# Patient Record
Sex: Male | Born: 1960 | Race: White | Hispanic: No | Marital: Single | Smoking: Never smoker
Health system: Southern US, Community
[De-identification: ages and names within clinical notes are randomized; demographics above are authoritative.]

## PROBLEM LIST (undated history)

## (undated) DIAGNOSIS — E785 Hyperlipidemia, unspecified: Secondary | ICD-10-CM

## (undated) DIAGNOSIS — R011 Cardiac murmur, unspecified: Secondary | ICD-10-CM

## (undated) DIAGNOSIS — F419 Anxiety disorder, unspecified: Secondary | ICD-10-CM

## (undated) DIAGNOSIS — Z8719 Personal history of other diseases of the digestive system: Secondary | ICD-10-CM

## (undated) DIAGNOSIS — F329 Major depressive disorder, single episode, unspecified: Secondary | ICD-10-CM

## (undated) DIAGNOSIS — K219 Gastro-esophageal reflux disease without esophagitis: Secondary | ICD-10-CM

## (undated) DIAGNOSIS — K649 Unspecified hemorrhoids: Secondary | ICD-10-CM

## (undated) DIAGNOSIS — I808 Phlebitis and thrombophlebitis of other sites: Secondary | ICD-10-CM

## (undated) DIAGNOSIS — D126 Benign neoplasm of colon, unspecified: Secondary | ICD-10-CM

## (undated) DIAGNOSIS — F32A Depression, unspecified: Secondary | ICD-10-CM

## (undated) DIAGNOSIS — N201 Calculus of ureter: Secondary | ICD-10-CM

## (undated) HISTORY — PX: APPENDECTOMY: SHX54

## (undated) HISTORY — PX: ROOT CANAL: SHX2363

## (undated) HISTORY — DX: Benign neoplasm of colon, unspecified: D12.6

## (undated) HISTORY — PX: OTHER SURGICAL HISTORY: SHX169

## (undated) HISTORY — PX: COLONOSCOPY: SHX174

## (undated) HISTORY — DX: Phlebitis and thrombophlebitis of other sites: I80.8

## (undated) HISTORY — DX: Unspecified hemorrhoids: K64.9

## (undated) HISTORY — DX: Cardiac murmur, unspecified: R01.1

## (undated) HISTORY — DX: Anxiety disorder, unspecified: F41.9

## (undated) HISTORY — PX: UPPER GI ENDOSCOPY: SHX6162

---

## 2012-05-23 ENCOUNTER — Emergency Department (HOSPITAL_COMMUNITY): Payer: Self-pay

## 2012-05-23 ENCOUNTER — Emergency Department (HOSPITAL_COMMUNITY)
Admission: EM | Admit: 2012-05-23 | Discharge: 2012-05-24 | Disposition: A | Discharge status: Home / Self Care | Payer: Self-pay | Attending: Emergency Medicine | Admitting: Emergency Medicine

## 2012-05-23 ENCOUNTER — Encounter (HOSPITAL_COMMUNITY): Payer: Self-pay | Admitting: *Deleted

## 2012-05-23 DIAGNOSIS — Z79899 Other long term (current) drug therapy: Secondary | ICD-10-CM | POA: Insufficient documentation

## 2012-05-23 DIAGNOSIS — R1032 Left lower quadrant pain: Secondary | ICD-10-CM | POA: Insufficient documentation

## 2012-05-23 DIAGNOSIS — Z7982 Long term (current) use of aspirin: Secondary | ICD-10-CM | POA: Insufficient documentation

## 2012-05-23 DIAGNOSIS — N200 Calculus of kidney: Secondary | ICD-10-CM | POA: Insufficient documentation

## 2012-05-23 LAB — LIPASE, BLOOD: Lipase: 28 U/L (ref 11–59)

## 2012-05-23 LAB — URINALYSIS, ROUTINE W REFLEX MICROSCOPIC
Ketones, ur: NEGATIVE mg/dL
Leukocytes, UA: NEGATIVE
Nitrite: NEGATIVE
Protein, ur: NEGATIVE mg/dL

## 2012-05-23 LAB — CBC WITH DIFFERENTIAL/PLATELET
Basophils Relative: 0 % (ref 0–1)
HCT: 44.1 % (ref 39.0–52.0)
Hemoglobin: 15.5 g/dL (ref 13.0–17.0)
MCHC: 35.1 g/dL (ref 30.0–36.0)
Monocytes Absolute: 0.5 10*3/uL (ref 0.1–1.0)
Monocytes Relative: 6 % (ref 3–12)
Neutro Abs: 5.2 10*3/uL (ref 1.7–7.7)

## 2012-05-23 LAB — COMPREHENSIVE METABOLIC PANEL
BUN: 12 mg/dL (ref 6–23)
CO2: 32 mEq/L (ref 19–32)
Chloride: 99 mEq/L (ref 96–112)
Creatinine, Ser: 0.97 mg/dL (ref 0.50–1.35)
GFR calc Af Amer: >90 mL/min (ref >90)
GFR calc non Af Amer: >90 mL/min (ref >90)
Glucose, Bld: 97 mg/dL (ref 70–99)
Total Bilirubin: 0.3 mg/dL (ref 0.3–1.2)

## 2012-05-23 NOTE — ED Notes (Signed)
abd cramping x 3 days; denies nausea/vomiting/diarrhea; denies fever

## 2012-05-24 MED ORDER — TAMSULOSIN HCL 0.4 MG PO CAPS: 0.4000 mg | ORAL_CAPSULE | Freq: Every day | ORAL | Status: DC

## 2012-05-24 MED ORDER — ONDANSETRON HCL 4 MG PO TABS: 4.0000 mg | ORAL_TABLET | Freq: Four times a day (QID) | ORAL | Status: DC

## 2012-05-24 MED ORDER — OXYCODONE-ACETAMINOPHEN 5-325 MG PO TABS: 2.0000 | ORAL_TABLET | ORAL | Status: DC | PRN

## 2012-05-24 NOTE — ED Provider Notes (Signed)
History     CSN: [id number]  Arrival date & time 05/23/12  2142   First MD Initiated Contact with Patient 05/23/12 2244      Chief Complaint  Patient presents with  . Abdominal Pain    (Consider location/radiation/quality/duration/timing/severity/associated sxs/prior treatment) HPI 51 year old male presents to emergency room with complaint of 7-10 days of intermittent left sided abdominal pain. Pain has ranged from lower abdomen to left lower quadrant. It lasts for a few days and then resolves. Tonight, he was at a play when he had acute onset of severe stabbing left lower quadrant pain. Patient denies previous history of kidney stones, though reports his father has history of period no history of diverticulitis, has not had a colonoscopy done yet. He denies any fever. He has not had any nausea vomiting or diarrhea. Patient has been self treating with Valium and Naprosyn which is helped symptoms somewhat. Pain was severe today bringing him into the emergency department.  History reviewed. No pertinent past medical history.  History reviewed. No pertinent past surgical history.  No family history on file.  History  Substance Use Topics  . Smoking status: Never Smoker   . Smokeless tobacco: Not on file  . Alcohol Use: No      Review of Systems  All other systems reviewed and are negative.    Allergies  Cleocin and Tetracyclines & related  Home Medications   Current Outpatient Rx  Name Route Sig Dispense Refill  . ASPIRIN EC 81 MG PO TBEC Oral Take 81 mg by mouth daily.    Marland Kitchen DIAZEPAM 5 MG PO TABS Oral Take 5 mg by mouth daily as needed. For anxiety    . ESCITALOPRAM OXALATE 10 MG PO TABS Oral Take 10 mg by mouth at bedtime.    Marland Kitchen SIMVASTATIN 10 MG PO TABS Oral Take 10 mg by mouth at bedtime.      BP 147/93  Pulse 69  Temp 98.4 F (36.9 C)  Resp 20  SpO2 100%  Physical Exam  Nursing note and vitals reviewed. Constitutional: He is oriented to person, place, and  time. He appears well-developed and well-nourished.  HENT:  Head: Normocephalic and atraumatic.  Nose: Nose normal.  Mouth/Throat: Oropharynx is clear and moist.  Eyes: Conjunctivae normal and EOM are normal. Pupils are equal, round, and reactive to light.  Neck: Normal range of motion. Neck supple. No JVD present. No tracheal deviation present. No thyromegaly present.  Cardiovascular: Normal rate, regular rhythm, normal heart sounds and intact distal pulses.  Exam reveals no gallop and no friction rub.   No murmur heard. Pulmonary/Chest: Effort normal and breath sounds normal. No stridor. No respiratory distress. He has no wheezes. He has no rales. He exhibits no tenderness.  Abdominal: Soft. Bowel sounds are normal. He exhibits no distension and no mass. There is no tenderness. There is no rebound and no guarding.       No CVA tenderness  Musculoskeletal: Normal range of motion. He exhibits no edema and no tenderness.  Lymphadenopathy:    He has no cervical adenopathy.  Neurological: He is alert and oriented to person, place, and time. He exhibits normal muscle tone. Coordination normal.  Skin: Skin is warm and dry. No rash noted. No erythema. No pallor.  Psychiatric: He has a normal mood and affect. His behavior is normal. Judgment and thought content normal.    ED Course  Procedures (including critical care time)  Results for orders placed during the hospital encounter of  05/23/12  CBC WITH DIFFERENTIAL      Component Value Range   WBC 7.9  4.0 - 10.5 K/uL   RBC 5.00  4.22 - 5.81 MIL/uL   Hemoglobin 15.5  13.0 - 17.0 g/dL   HCT 09.8  11.9 - 14.7 %   MCV 88.2  78.0 - 100.0 fL   MCH 31.0  26.0 - 34.0 pg   MCHC 35.1  30.0 - 36.0 g/dL   RDW 82.9  56.2 - 13.0 %   Platelets 214  150 - 400 K/uL   Neutrophils Relative 66  43 - 77 %   Neutro Abs 5.2  1.7 - 7.7 K/uL   Lymphocytes Relative 27  12 - 46 %   Lymphs Abs 2.1  0.7 - 4.0 K/uL   Monocytes Relative 6  3 - 12 %   Monocytes  Absolute 0.5  0.1 - 1.0 K/uL   Eosinophils Relative 1  0 - 5 %   Eosinophils Absolute 0.1  0.0 - 0.7 K/uL   Basophils Relative 0  0 - 1 %   Basophils Absolute 0.0  0.0 - 0.1 K/uL  COMPREHENSIVE METABOLIC PANEL      Component Value Range   Sodium 139  135 - 145 mEq/L   Potassium 4.1  3.5 - 5.1 mEq/L   Chloride 99  96 - 112 mEq/L   CO2 32  19 - 32 mEq/L   Glucose, Bld 97  70 - 99 mg/dL   BUN 12  6 - 23 mg/dL   Creatinine, Ser 8.65  0.50 - 1.35 mg/dL   Calcium 78.4  8.4 - 69.6 mg/dL   Total Protein 7.7  6.0 - 8.3 g/dL   Albumin 4.1  3.5 - 5.2 g/dL   AST 24  0 - 37 U/L   ALT 20  0 - 53 U/L   Alkaline Phosphatase 81  39 - 117 U/L   Total Bilirubin 0.3  0.3 - 1.2 mg/dL   GFR calc non Af Amer >90  >90 mL/min   GFR calc Af Amer >90  >90 mL/min  LIPASE, BLOOD      Component Value Range   Lipase 28  11 - 59 U/L  URINALYSIS, ROUTINE W REFLEX MICROSCOPIC      Component Value Range   Color, Urine YELLOW  YELLOW   APPearance CLOUDY (*) CLEAR   Specific Gravity, Urine 1.005  1.005 - 1.030   pH 7.0  5.0 - 8.0   Glucose, UA NEGATIVE  NEGATIVE mg/dL   Hgb urine dipstick LARGE (*) NEGATIVE   Bilirubin Urine NEGATIVE  NEGATIVE   Ketones, ur NEGATIVE  NEGATIVE mg/dL   Protein, ur NEGATIVE  NEGATIVE mg/dL   Urobilinogen, UA 0.2  0.0 - 1.0 mg/dL   Nitrite NEGATIVE  NEGATIVE   Leukocytes, UA NEGATIVE  NEGATIVE  URINE MICROSCOPIC-ADD ON      Component Value Range   Squamous Epithelial / LPF RARE  RARE   RBC / HPF 11-20  <3 RBC/hpf   Ct Abdomen Pelvis Wo Contrast  05/24/2012  *RADIOLOGY REPORT*  Clinical Data: Hematuria and left inguinal pain.  CT ABDOMEN AND PELVIS WITHOUT CONTRAST  Technique:  Multidetector CT imaging of the abdomen and pelvis was performed following the standard protocol without intravenous contrast.  Comparison: None.  Findings: Lung bases are clear.  There is no evidence for free intraperitoneal air.  There is a 3.7 cm low density structure involving the right kidney which  is most compatible with  a cortical cyst.  There is mild fullness of the left ureter and concern for a 6 mm stone in the distal left ureter.   It is difficult to know if the stone is adjacent to the ureter or within the ureter. There appears to be mild dilatation and stranding of the left ureter above the calcification suggesting that the stone is within the ureter.  No gross abnormality to the liver, gallbladder, spleen, stomach, pancreas or adrenal glands.  There is mild scoliosis in the lumbar spine.  No evidence for lymphadenopathy or free fluid.  Normal appearance of the prostate, seminal vesicles and urinary bladder.  No acute bony abnormality.  IMPRESSION: There appears to be a 6 mm stone within the distal left ureter. There is minimal dilatation of the left renal collecting system.  Probable right renal cyst.   Original Report Authenticated By: Richarda Overlie, M.D.        1. Kidney stone on left side       MDM  51 year old male with 6 mm stone on the left. This iss upper limit of what should be able to pass on its own per literature, will have patient follow up with the [facility] treat with pain and nausea medicine along with Flomax.        Olivia Mackie, MD 05/24/12 [phone]

## 2012-05-24 NOTE — ED Notes (Signed)
Strainer provided

## 2013-01-27 ENCOUNTER — Other Ambulatory Visit: Payer: Self-pay | Admitting: Urology

## 2013-01-27 ENCOUNTER — Encounter (HOSPITAL_COMMUNITY): Payer: Self-pay | Admitting: Pharmacy Technician

## 2013-02-04 ENCOUNTER — Encounter (HOSPITAL_COMMUNITY): Payer: Self-pay | Admitting: *Deleted

## 2013-02-09 ENCOUNTER — Ambulatory Visit (HOSPITAL_COMMUNITY)
Admission: RE | Admit: 2013-02-09 | Discharge: 2013-02-09 | Disposition: A | Discharge status: Home / Self Care | Payer: BC Managed Care – PPO | Source: Ambulatory Visit | Attending: Urology | Admitting: Urology

## 2013-02-09 ENCOUNTER — Ambulatory Visit (HOSPITAL_COMMUNITY): Payer: BC Managed Care – PPO

## 2013-02-09 ENCOUNTER — Encounter (HOSPITAL_COMMUNITY)
Admission: RE | Disposition: A | Discharge status: Home / Self Care | Payer: Self-pay | Source: Ambulatory Visit | Attending: Urology

## 2013-02-09 ENCOUNTER — Encounter (HOSPITAL_COMMUNITY): Payer: Self-pay | Admitting: *Deleted

## 2013-02-09 DIAGNOSIS — F3289 Other specified depressive episodes: Secondary | ICD-10-CM | POA: Insufficient documentation

## 2013-02-09 DIAGNOSIS — N201 Calculus of ureter: Secondary | ICD-10-CM | POA: Insufficient documentation

## 2013-02-09 DIAGNOSIS — Z79899 Other long term (current) drug therapy: Secondary | ICD-10-CM | POA: Insufficient documentation

## 2013-02-09 DIAGNOSIS — E7800 Pure hypercholesterolemia, unspecified: Secondary | ICD-10-CM | POA: Insufficient documentation

## 2013-02-09 DIAGNOSIS — F329 Major depressive disorder, single episode, unspecified: Secondary | ICD-10-CM | POA: Insufficient documentation

## 2013-02-09 HISTORY — DX: Depression, unspecified: F32.A

## 2013-02-09 HISTORY — DX: Major depressive disorder, single episode, unspecified: F32.9

## 2013-02-09 HISTORY — PX: EXTRACORPOREAL SHOCK WAVE LITHOTRIPSY: SHX1557

## 2013-02-09 SURGERY — LITHOTRIPSY, ESWL
Anesthesia: LOCAL | Laterality: Left

## 2013-02-09 MED ORDER — DEXTROSE-NACL 5-0.45 % IV SOLN
INTRAVENOUS | Status: DC
  Administered 2013-02-09: 09:00:00 via INTRAVENOUS

## 2013-02-09 MED ORDER — SODIUM CHLORIDE 0.9 % IV SOLN: 250.0000 mL | INTRAVENOUS | Status: DC | PRN

## 2013-02-09 MED ORDER — ACETAMINOPHEN 650 MG RE SUPP
650.0000 mg | RECTAL | Status: DC | PRN
  Filled 2013-02-09: qty 1

## 2013-02-09 MED ORDER — OXYCODONE HCL 5 MG PO TABS: 5.0000 mg | ORAL_TABLET | ORAL | Status: DC | PRN

## 2013-02-09 MED ORDER — DIAZEPAM 5 MG PO TABS
10.0000 mg | ORAL_TABLET | ORAL | Status: AC
  Administered 2013-02-09: 10 mg via ORAL
  Filled 2013-02-09: qty 2

## 2013-02-09 MED ORDER — LEVOFLOXACIN 500 MG PO TABS
500.0000 mg | ORAL_TABLET | ORAL | Status: AC
  Administered 2013-02-09: 500 mg via ORAL
  Filled 2013-02-09: qty 1

## 2013-02-09 MED ORDER — DIPHENHYDRAMINE HCL 25 MG PO CAPS
25.0000 mg | ORAL_CAPSULE | ORAL | Status: AC
  Administered 2013-02-09: 25 mg via ORAL
  Filled 2013-02-09: qty 1

## 2013-02-09 MED ORDER — ACETAMINOPHEN 325 MG PO TABS: 650.0000 mg | ORAL_TABLET | ORAL | Status: DC | PRN

## 2013-02-09 MED ORDER — ONDANSETRON HCL 4 MG/2ML IJ SOLN: 4.0000 mg | Freq: Four times a day (QID) | INTRAMUSCULAR | Status: DC | PRN

## 2013-02-09 MED ORDER — SODIUM CHLORIDE 0.9 % IJ SOLN: 3.0000 mL | Freq: Two times a day (BID) | INTRAMUSCULAR | Status: DC

## 2013-02-09 MED ORDER — ACETAMINOPHEN 10 MG/ML IV SOLN: 1000.0000 mg | Freq: Four times a day (QID) | INTRAVENOUS | Status: DC

## 2013-02-09 MED ORDER — SODIUM CHLORIDE 0.9 % IJ SOLN: 3.0000 mL | INTRAMUSCULAR | Status: DC | PRN

## 2013-02-09 MED ORDER — HYDROCODONE-ACETAMINOPHEN 5-325 MG PO TABS: 1.0000 | ORAL_TABLET | ORAL | Status: DC | PRN

## 2013-02-09 NOTE — H&P (Signed)
  Urology History and Physical Exam  CC: Left ureteral stone  HPI: 52 year old male presents for ESL of a non-progressing 7 mm left distal ureteral stone. He initially presented with pain in October 2013 and was found then to have a stone then. He did not followup until more recently when he was found to have a persistent stone. He has decided on treatment with ESL over ureterocopy, having been counseled on risks/benefits of each.  PMH: Past Medical History  Diagnosis Date  . Hypercholesterolemia   . Depression     takes lexapro  . Chronic kidney disease 01/2013    kidney stones    PSH: Past Surgical History  Procedure Laterality Date  . No past surgeries      Allergies: Allergies  Allergen Reactions  . Cleocin (Clindamycin Hcl) Other (See Comments)    Chest pressure and tightness  . Tetracyclines & Related Other (See Comments)    Chest pressure and tightness    Medications: No prescriptions prior to admission     Social History: History   Social History  . Marital Status: Single    Spouse Name: N/A    Number of Children: N/A  . Years of Education: N/A   Occupational History  . Not on file.   Social History Main Topics  . Smoking status: Never Smoker   . Smokeless tobacco: Not on file  . Alcohol Use: No  . Drug Use: No  . Sexually Active: Not on file   Other Topics Concern  . Not on file   Social History Narrative  . No narrative on file    Family History: History reviewed. No pertinent family history.  Review of Systems: Positive: N/A Negative:  A further 10 point review of systems was negative except what is listed in the HPI.  Physical Exam: @VITALS2 @ General: No acute distress.  Awake. Head:  Normocephalic.  Atraumatic. ENT:  EOMI.  Mucous membranes moist Neck:  Supple.  No lymphadenopathy. CV:  S1 present. S2 present. Regular rate. Pulmonary: Equal effort bilaterally.  Clear to auscultation bilaterally. Abdomen: Soft.  Non tender to  palpation. Skin:  Normal turgor.  No visible rash. Extremity: No gross deformity of bilateral upper extremities.  No gross deformity of    bilateral lower extremities. Neurologic: Alert. Appropriate mood.    Studies:  No results found for this basename: HGB, WBC, PLT,  in the last 72 hours  No results found for this basename: NA, K, CL, CO2, BUN, CREATININE, CALCIUM, MAGNESIUM, GFRNONAA, GFRAA,  in the last 72 hours   No results found for this basename: PT, INR, APTT,  in the last 72 hours   No components found with this basename: ABG,     Assessment:  7 mm left distal ureteral stone  Plan: Left ESL

## 2013-06-03 ENCOUNTER — Other Ambulatory Visit: Payer: Self-pay | Admitting: Urology

## 2013-06-03 ENCOUNTER — Encounter (HOSPITAL_BASED_OUTPATIENT_CLINIC_OR_DEPARTMENT_OTHER): Payer: Self-pay | Admitting: *Deleted

## 2013-06-04 NOTE — Progress Notes (Signed)
SPOKE W/ PT.  STATES HE PASSED STONE AND HAS ALREADY CALLED THE OFFICE AND IS AWAITING CALLBACK . PROBABLE CX CASE.

## 2013-06-08 ENCOUNTER — Encounter (HOSPITAL_BASED_OUTPATIENT_CLINIC_OR_DEPARTMENT_OTHER): Admission: RE | Payer: Self-pay | Source: Ambulatory Visit

## 2013-06-08 ENCOUNTER — Ambulatory Visit (HOSPITAL_BASED_OUTPATIENT_CLINIC_OR_DEPARTMENT_OTHER): Admission: RE | Admit: 2013-06-08 | Payer: BC Managed Care – PPO | Source: Ambulatory Visit | Admitting: Urology

## 2013-06-08 HISTORY — DX: Hyperlipidemia, unspecified: E78.5

## 2013-06-08 HISTORY — DX: Calculus of ureter: N20.1

## 2013-06-08 HISTORY — DX: Gastro-esophageal reflux disease without esophagitis: K21.9

## 2013-06-08 SURGERY — CYSTOURETEROSCOPY, WITH RETROGRADE PYELOGRAM AND STENT INSERTION
Anesthesia: General | Laterality: Left

## 2014-09-21 ENCOUNTER — Encounter: Payer: Self-pay | Admitting: Internal Medicine

## 2014-11-16 ENCOUNTER — Encounter: Payer: Self-pay | Admitting: Internal Medicine

## 2014-11-16 ENCOUNTER — Ambulatory Visit (INDEPENDENT_AMBULATORY_CARE_PROVIDER_SITE_OTHER): Payer: BLUE CROSS/BLUE SHIELD | Admitting: Internal Medicine

## 2014-11-16 VITALS — BP 122/70 | HR 76 | Ht 67.5 in | Wt 168.2 lb

## 2014-11-16 DIAGNOSIS — R142 Eructation: Secondary | ICD-10-CM

## 2014-11-16 DIAGNOSIS — K219 Gastro-esophageal reflux disease without esophagitis: Secondary | ICD-10-CM

## 2014-11-16 DIAGNOSIS — K648 Other hemorrhoids: Secondary | ICD-10-CM

## 2014-11-16 DIAGNOSIS — Z1211 Encounter for screening for malignant neoplasm of colon: Secondary | ICD-10-CM

## 2014-11-16 NOTE — Patient Instructions (Signed)

## 2014-11-16 NOTE — Progress Notes (Signed)
Patient ID: Jimmy Edwards, male   DOB: [birth year], 54 y.o.   MRN: [id number] HPI: Jimmy Edwards is a 54 yo male with PMH of kidney stone, hyperlipidemia and depression who is seen in consultation at the request of Dr. Joylene Draft to evaluate for colorectal cancer screening and also chronic eructation.  He is here alone today. He reports that he's had chronic belching for many years. He does have a history of heartburn over the last few years and takes omeprazole 20 mg daily. He reports this has not helped with the belching. Belching is often postprandial and can be somewhat uncomfortable. He denies abdominal bloating. He raises the question of possible hiatal hernia contributing to belching symptoms. On one occasion he developed epigastric pain near the xiphoid which was acute and noticed after he will call up one morning. This was treated with Zofran because of associated nausea and resolved on its own. He's no longer had epigastric or other abdominal pain. He denies dysphagia and odynophagia. Reports bowel movements are regular without diarrhea or constipation. He does report a history of a prolapsed hemorrhoid which leads to bleeding with passing stool and occasional smearing. He has never had colonoscopy. He denies a family history of colon cancer though his mother had benign colon polyps.  Past Medical History  Diagnosis Date  . Depression     takes lexapro  . Left ureteral calculus   . Hyperlipidemia   . GERD (gastroesophageal reflux disease)   . Anxiety     Past Surgical History  Procedure Laterality Date  . Extracorporeal shock wave lithotripsy Left 02-09-2013    Outpatient Prescriptions Prior to Visit  Medication Sig Dispense Refill  . diazepam (VALIUM) 5 MG tablet Take 5-10 mg by mouth daily as needed for anxiety. For anxiety    . escitalopram (LEXAPRO) 20 MG tablet Take 20 mg by mouth at bedtime.    . Multiple Vitamin (MULTIVITAMIN WITH MINERALS) TABS Take 1 tablet by mouth daily.    Marland Kitchen  omeprazole (PRILOSEC) 20 MG capsule Take 20 mg by mouth daily.    . simvastatin (ZOCOR) 20 MG tablet Take 20 mg by mouth every evening.    . Soft Lens Products (REFRESH CONTACTS DROPS) SOLN Place 1 drop into both eyes 2 (two) times daily as needed (dry eyes).    Marland Kitchen HYDROcodone-acetaminophen (NORCO) 5-325 MG per tablet Take 1-2 tablets by mouth every 4 (four) hours as needed for pain. 30 tablet 0   No facility-administered medications prior to visit.    Allergies  Allergen Reactions  . Cleocin [Clindamycin Hcl] Other (See Comments)    Chest pressure and tightness  . Tetracyclines & Related Other (See Comments)    Chest pressure and tightness    Family History  Problem Relation Age of Onset  . Lung cancer Maternal Grandmother   . Colon cancer Neg Hx   . Colon polyps Mother   . Heart disease Father   . Heart disease Maternal Grandmother   . Esophageal cancer Neg Hx   . Kidney disease Neg Hx   . Diabetes Neg Hx   . Gallbladder disease Neg Hx     History  Substance Use Topics  . Smoking status: Never Smoker   . Smokeless tobacco: Never Used  . Alcohol Use: 0.0 oz/week    0 Standard drinks or equivalent per week     Comment: Rarely    ROS: As per history of present illness, otherwise negative  BP 122/70 mmHg  Pulse 76  Ht  5' 7.5" (1.715 m)  Wt 168 lb 4 oz (76.318 kg)  BMI 25.95 kg/m2 Constitutional: Well-developed and well-nourished. No distress. HEENT: Normocephalic and atraumatic. Oropharynx is clear and moist. No oropharyngeal exudate. Conjunctivae are normal.  No scleral icterus. Neck: Neck supple. Trachea midline. Cardiovascular: Normal rate, regular rhythm and intact distal pulses. No M/R/G Pulmonary/chest: Effort normal and breath sounds normal. No wheezing, rales or rhonchi. Abdominal: Soft, nontender, nondistended. Bowel sounds active throughout. There are no masses palpable. No hepatosplenomegaly. Extremities: no clubbing, cyanosis, or edema Lymphadenopathy: No  cervical adenopathy noted. Neurological: Alert and oriented to person place and time. Skin: Skin is warm and dry. No rashes noted. Psychiatric: Normal mood and affect. Behavior is normal.  RELEVANT LABS AND IMAGING: CBC    Component Value Date/Time   WBC 7.9 05/23/2012 2200   RBC 5.00 05/23/2012 2200   HGB 15.5 05/23/2012 2200   HCT 44.1 05/23/2012 2200   PLT 214 05/23/2012 2200   MCV 88.2 05/23/2012 2200   MCH 31.0 05/23/2012 2200   MCHC 35.1 05/23/2012 2200   RDW 11.9 05/23/2012 2200   LYMPHSABS 2.1 05/23/2012 2200   MONOABS 0.5 05/23/2012 2200   EOSABS 0.1 05/23/2012 2200   BASOSABS 0.0 05/23/2012 2200    CMP     Component Value Date/Time   NA 139 05/23/2012 2200   K 4.1 05/23/2012 2200   CL 99 05/23/2012 2200   CO2 32 05/23/2012 2200   GLUCOSE 97 05/23/2012 2200   BUN 12 05/23/2012 2200   CREATININE 0.97 05/23/2012 2200   CALCIUM 10.0 05/23/2012 2200   PROT 7.7 05/23/2012 2200   ALBUMIN 4.1 05/23/2012 2200   AST 24 05/23/2012 2200   ALT 20 05/23/2012 2200   ALKPHOS 81 05/23/2012 2200   BILITOT 0.3 05/23/2012 2200   GFRNONAA >90 05/23/2012 2200   GFRAA >90 05/23/2012 2200    ASSESSMENT/PLAN: Jimmy Edwards is a 54 yo male with PMH of kidney stone, hyperlipidemia and depression who is seen in consultation at the request of Dr. Joylene Draft to evaluate for colorectal cancer screening and also chronic eructation with hx of GERD  1. Eructation -- may be a reflux symptom or possibly anatomic relating to hiatal hernia. Bacterial overgrowth could also be leading to increased gas and belching. He will continue omeprazole 20 mg daily. I recommended upper endoscopy. We discussed the risks and benefits and he is agreeable to proceed. This will also exclude Barrett's in this middle-aged white male with reflux history  2. CRC screening -- colonoscopy recommended. We discussed the risks and benefits he agrees to proceed  3. Prolapsed hemorrhoid with bleeding -- symptoms likely  related to internal hemorrhoid with prolapse. Will be a little better evaluate this the time of rectal exam and colonoscopy. We discussed if this is indeed the source for his symptoms, he may be a very good candidate for an office hemorrhoidal banding. We discussed this procedure at length and will decide if this is a good option for him after colonoscopy. See #2   IZ:TIWP Joylene Draft, MD

## 2014-12-08 ENCOUNTER — Encounter: Payer: Self-pay | Admitting: Internal Medicine

## 2014-12-27 ENCOUNTER — Ambulatory Visit (AMBULATORY_SURGERY_CENTER): Payer: BLUE CROSS/BLUE SHIELD | Admitting: Internal Medicine

## 2014-12-27 ENCOUNTER — Encounter: Payer: Self-pay | Admitting: Internal Medicine

## 2014-12-27 VITALS — BP 109/51 | HR 87 | Temp 98.9°F | Resp 13 | Ht 67.0 in | Wt 168.0 lb

## 2014-12-27 DIAGNOSIS — K219 Gastro-esophageal reflux disease without esophagitis: Secondary | ICD-10-CM

## 2014-12-27 DIAGNOSIS — D121 Benign neoplasm of appendix: Secondary | ICD-10-CM

## 2014-12-27 DIAGNOSIS — D120 Benign neoplasm of cecum: Secondary | ICD-10-CM | POA: Diagnosis not present

## 2014-12-27 DIAGNOSIS — Z1211 Encounter for screening for malignant neoplasm of colon: Secondary | ICD-10-CM | POA: Diagnosis not present

## 2014-12-27 DIAGNOSIS — R142 Eructation: Secondary | ICD-10-CM | POA: Diagnosis not present

## 2014-12-27 MED ORDER — SODIUM CHLORIDE 0.9 % IV SOLN: 500.0000 mL | INTRAVENOUS | Status: DC

## 2014-12-27 NOTE — Patient Instructions (Signed)
Discharge instructions given. Handouts on hiatal hernia and polyps given. Resume previous medications. YOU HAD AN ENDOSCOPIC PROCEDURE TODAY AT [facility]:   Refer to the procedure report that was given to you for any specific questions about what was found during the examination.  If the procedure report does not answer your questions, please call your gastroenterologist to clarify.  If you requested that your care partner not be given the details of your procedure findings, then the procedure report has been included in a sealed envelope for you to review at your convenience later.  YOU SHOULD EXPECT: Some feelings of bloating in the abdomen. Passage of more gas than usual.  Walking can help get rid of the air that was put into your GI tract during the procedure and reduce the bloating. If you had a lower endoscopy (such as a colonoscopy or flexible sigmoidoscopy) you may notice spotting of blood in your stool or on the toilet paper. If you underwent a bowel prep for your procedure, you may not have a normal bowel movement for a few days.  Please Note:  You might notice some irritation and congestion in your nose or some drainage.  This is from the oxygen used during your procedure.  There is no need for concern and it should clear up in a day or so.  SYMPTOMS TO REPORT IMMEDIATELY:   Following lower endoscopy (colonoscopy or flexible sigmoidoscopy):  Excessive amounts of blood in the stool  Significant tenderness or worsening of abdominal pains  Swelling of the abdomen that is new, acute  Fever of 100F or higher   Following upper endoscopy (EGD)  Vomiting of blood or coffee ground material  New chest pain or pain under the shoulder blades  Painful or persistently difficult swallowing  New shortness of breath  Fever of 100F or higher  Black, tarry-looking stools  For urgent or emergent issues, a gastroenterologist can be reached at any hour by calling [phone]  [phone].   DIET: Your first meal following the procedure should be a small meal and then it is ok to progress to your normal diet. Heavy or fried foods are harder to digest and may make you feel nauseous or bloated.  Likewise, meals heavy in dairy and vegetables can increase bloating.  Drink plenty of fluids but you should avoid alcoholic beverages for 24 hours.  ACTIVITY:  You should plan to take it easy for the rest of today and you should NOT DRIVE or use heavy machinery until tomorrow (because of the sedation medicines used during the test).    FOLLOW UP: Our staff will call the number listed on your records the next business day following your procedure to check on you and address any questions or concerns that you may have regarding the information given to you following your procedure. If we do not reach you, we will leave a message.  However, if you are feeling well and you are not experiencing any problems, there is no need to return our call.  We will assume that you have returned to your regular daily activities without incident.  If any biopsies were taken you will be contacted by phone or by letter within the next 1-3 weeks.  Please call us at [phone] if you have not heard about the biopsies in 3 weeks.    SIGNATURES/CONFIDENTIALITY: You and/or your care partner have signed paperwork which will be entered into your electronic medical record.  These signatures attest to the fact  that that the information above on your After Visit Summary has been reviewed and is understood.  Full responsibility of the confidentiality of this discharge information lies with you and/or your care-partner.

## 2014-12-27 NOTE — Op Note (Signed)
[place]  [address] [place], [address]   COLONOSCOPY PROCEDURE REPORT  PATIENT: Jimmy Edwards, Jimmy Edwards  MR#: [id number] BIRTHDATE: 23-Aug-1961 , 69  yrs. old GENDER: male ENDOSCOPIST: Jerene Bears, MD REFERRED TD:VVOH Perini, M.D. PROCEDURE DATE:  12/27/2014 PROCEDURE:   Colonoscopy with biopsy and Colonoscopy, screening First Screening Colonoscopy - Avg.  risk and is 50 yrs.  old or older Yes.  Prior Negative Screening - Now for repeat screening. N/A  History of Adenoma - Now for follow-up colonoscopy & has been > or = to 3 yrs.  N/A ASA CLASS:   Class II INDICATIONS:Screening for colonic neoplasia and Colorectal Neoplasm Risk Assessment for this procedure is average risk. MEDICATIONS: Monitored anesthesia care, Propofol 100 mg IV, Residual sedation present, and ephedrine 20 mg IV  DESCRIPTION OF PROCEDURE:   After the risks benefits and alternatives of the procedure were thoroughly explained, informed consent was obtained.  The digital rectal exam revealed no rectal mass.   The LB CF-[id number] Loaner [id number]  endoscope was introduced through the anus and advanced to the cecum, which was identified by both the appendix and ileocecal valve. No adverse events experienced.   The quality of the prep was good.  (MoviPrep was used)  The instrument was then slowly withdrawn as the colon was fully examined.   COLON FINDINGS: A sessile polyp ranging from 20 to 51mm in size was found at the appendiceal orifice. This large polyp appears to be growing from the appendiceal lumen and is has a broad base.  Given location, likely involving the appendix, complete endoscopic resection will be difficult to ensure. Multiple biopsies were performed using cold forceps.   The examination was otherwise normal.  Retroflexed views revealed moderate internal hemorrhoids. The time to cecum = 2.5 Withdrawal time = 18.3   The scope was withdrawn and the procedure completed.  COMPLICATIONS:  There were no immediate complications.  ENDOSCOPIC IMPRESSION: 1.   Sessile polyp ranging from 20 to 22mm in size was found at the appendiceal orifice; multiple biopsies were performed using cold forceps 2.   The examination was otherwise normal  RECOMMENDATIONS: 1.  Await biopsy results 2.  Surgical referral for resection of appendiceal orifice polyp  eSigned:  Jerene Bears, MD 12/27/2014 3:37 PM   cc: Crist Infante, MD and The Patient

## 2014-12-27 NOTE — Progress Notes (Signed)
Called to room to assist during endoscopic procedure.  Patient ID and intended procedure confirmed with present staff. Received instructions for my participation in the procedure from the performing physician.  

## 2014-12-27 NOTE — Progress Notes (Signed)
Report to PACU, RN, vss, BBS= Clear.  

## 2014-12-27 NOTE — Op Note (Signed)
[facility]  [address] [employer] [address] [place], [id number]   ENDOSCOPY PROCEDURE REPORT  PATIENT: Jimmy Edwards, Jimmy Edwards  MR#: [id number] BIRTHDATE: March 26, 1961 , 34  yrs. old GENDER: male ENDOSCOPIST: Jerene Bears, MD REFERRED BY:  Crist Infante, M.D. PROCEDURE DATE:  12/27/2014 PROCEDURE:  EGD, diagnostic ASA CLASS:     Class II INDICATIONS:  history of esophageal reflux and eructation. MEDICATIONS: Monitored anesthesia care and Propofol 100 mg IV TOPICAL ANESTHETIC: none  DESCRIPTION OF PROCEDURE: After the risks benefits and alternatives of the procedure were thoroughly explained, informed consent was obtained.  The LB [id number] endoscope was introduced through the mouth and advanced to the second portion of the duodenum , Without limitations.  The instrument was slowly withdrawn as the mucosa was fully examined.   ESOPHAGUS: The mucosa of the esophagus appeared normal.  Z-line regular  STOMACH: A 3 cm hiatal hernia was noted.   The mucosa of the stomach appeared normal.  DUODENUM: The duodenal mucosa showed no abnormalities in the bulb and 2nd part of the duodenum.  Retroflexed views revealed a hiatal hernia.     The scope was then withdrawn from the patient and the procedure completed.  COMPLICATIONS: There were no immediate complications.  ENDOSCOPIC IMPRESSION: 1.   The mucosa of the esophagus appeared normal 2.   3 cm hiatal hernia 3.   The mucosa of the stomach appeared normal 4.   The duodenal mucosa showed no abnormalities in the bulb and 2nd part of the duodenum  RECOMMENDATIONS: Continue PPI to control reflux symptoms.  If belching continues to be a problem then consider empiric treatment for possible bacterial overgrowth  eSigned:  Jerene Bears, MD 12/27/2014 3:28 PM    LL:VDIX Perini, MD and The Patient

## 2014-12-28 ENCOUNTER — Telehealth: Payer: Self-pay

## 2014-12-28 NOTE — Telephone Encounter (Signed)
No answer, left message

## 2014-12-29 ENCOUNTER — Telehealth: Payer: Self-pay

## 2014-12-29 NOTE — Telephone Encounter (Signed)
Pt scheduled to see Dr. Zella Richer with [facility] 01/13/15@4pm , pt to arrive there at 3:30pm. Pt being seen for polyp on appendix. Left message for pt to call back.

## 2014-12-29 NOTE — Telephone Encounter (Signed)
Spoke with pt and he is aware of appt 

## 2014-12-31 ENCOUNTER — Encounter: Payer: Self-pay | Admitting: Internal Medicine

## 2015-01-03 ENCOUNTER — Telehealth: Payer: Self-pay | Admitting: Internal Medicine

## 2015-01-03 NOTE — Telephone Encounter (Signed)
Discussed pathology results and letter with pt.

## 2015-01-05 ENCOUNTER — Other Ambulatory Visit: Payer: Self-pay | Admitting: General Surgery

## 2015-01-27 ENCOUNTER — Encounter (HOSPITAL_COMMUNITY): Payer: Self-pay

## 2015-01-27 ENCOUNTER — Encounter (HOSPITAL_COMMUNITY)
Admission: RE | Admit: 2015-01-27 | Discharge: 2015-01-27 | Disposition: A | Discharge status: Home / Self Care | Payer: BLUE CROSS/BLUE SHIELD | Source: Ambulatory Visit | Attending: General Surgery | Admitting: General Surgery

## 2015-01-27 DIAGNOSIS — D126 Benign neoplasm of colon, unspecified: Secondary | ICD-10-CM | POA: Insufficient documentation

## 2015-01-27 DIAGNOSIS — Z01812 Encounter for preprocedural laboratory examination: Secondary | ICD-10-CM | POA: Diagnosis not present

## 2015-01-27 HISTORY — DX: Personal history of other diseases of the digestive system: Z87.19

## 2015-01-27 LAB — COMPREHENSIVE METABOLIC PANEL
ALBUMIN: 4.1 g/dL (ref 3.5–5.0)
ALT: 26 U/L (ref 17–63)
ANION GAP: 8 (ref 5–15)
AST: 30 U/L (ref 15–41)
Alkaline Phosphatase: 77 U/L (ref 38–126)
BILIRUBIN TOTAL: 0.4 mg/dL (ref 0.3–1.2)
BUN: 21 mg/dL — AB (ref 6–20)
CHLORIDE: 102 mmol/L (ref 101–111)
CO2: 29 mmol/L (ref 22–32)
CREATININE: 1.01 mg/dL (ref 0.61–1.24)
Calcium: 9.7 mg/dL (ref 8.9–10.3)
GFR calc non Af Amer: >60 mL/min (ref >60)
Glucose, Bld: 87 mg/dL (ref 65–99)
Potassium: 4.8 mmol/L (ref 3.5–5.1)
Sodium: 139 mmol/L (ref 135–145)
Total Protein: 7.1 g/dL (ref 6.5–8.1)

## 2015-01-27 LAB — CBC WITH DIFFERENTIAL/PLATELET
Basophils Absolute: 0 10*3/uL (ref 0.0–0.1)
Basophils Relative: 1 % (ref 0–1)
Eosinophils Absolute: 0.1 10*3/uL (ref 0.0–0.7)
Eosinophils Relative: 2 % (ref 0–5)
HCT: 43.1 % (ref 39.0–52.0)
Hemoglobin: 14.9 g/dL (ref 13.0–17.0)
LYMPHS ABS: 1.6 10*3/uL (ref 0.7–4.0)
Lymphocytes Relative: 29 % (ref 12–46)
MCH: 30.7 pg (ref 26.0–34.0)
MCHC: 34.6 g/dL (ref 30.0–36.0)
MCV: 88.9 fL (ref 78.0–100.0)
Monocytes Absolute: 0.5 10*3/uL (ref 0.1–1.0)
Monocytes Relative: 9 % (ref 3–12)
NEUTROS PCT: 59 % (ref 43–77)
Neutro Abs: 3.3 10*3/uL (ref 1.7–7.7)
PLATELETS: 194 10*3/uL (ref 150–400)
RBC: 4.85 MIL/uL (ref 4.22–5.81)
RDW: 12.1 % (ref 11.5–15.5)
WBC: 5.4 10*3/uL (ref 4.0–10.5)

## 2015-01-27 LAB — PROTIME-INR
INR: 1.02 (ref 0.00–1.49)
Prothrombin Time: 13.6 seconds (ref 11.6–15.2)

## 2015-01-27 NOTE — Progress Notes (Signed)
EKG per chart 09/16/2014

## 2015-01-27 NOTE — Patient Instructions (Addendum)
Jimmy Edwards  01/27/2015   Your procedure is scheduled on: Tuesday February 01, 2015  Report to [facility] Main  Entrance and follow signs to               [place] arrive at 0730 AM.  Call this number if you have problems the morning of surgery [phone]   Remember: ONLY 1 PERSON MAY GO WITH YOU TO SHORT STAY TO GET  READY MORNING OF [place].  Do not eat food or drink liquids :After Midnight.  FOLLOW BOWEL PROGRAM AS PRESCRIBED BY MD.   Take these medicines the morning of surgery with A SIP OF WATER: Diazepam (Valium) if needed; Omeprazole (Prilosec); eye drops if needed; Escitalopram (Lexapro)                               You may not have any metal on your body including hair pins and              piercings  Do not wear jewelry, lotions, powders or colognes, deodorant             Men may shave face and neck.   Do not bring valuables to the hospital. [place].  Contacts, dentures or bridgework may not be worn into surgery.  Leave suitcase in the car. After surgery it may be brought to your room.     _____________________________________________________________________             [facility] - Preparing for Surgery Before surgery, you can play an important role.  Because skin is not sterile, your skin needs to be as free of germs as possible.  You can reduce the number of germs on your skin by washing with CHG (chlorahexidine gluconate) soap before surgery.  CHG is an antiseptic cleaner which kills germs and bonds with the skin to continue killing germs even after washing. Please DO NOT use if you have an allergy to CHG or antibacterial soaps.  If your skin becomes reddened/irritated stop using the CHG and inform your nurse when you arrive at Short Stay. Do not shave (including legs and underarms) for at least 48 hours prior to the first CHG shower.  You may shave your face/neck. Please follow  these instructions carefully:  1.  Shower with CHG Soap the night before surgery and the  morning of Surgery.  2.  If you choose to wash your hair, wash your hair first as usual with your  normal  shampoo.  3.  After you shampoo, rinse your hair and body thoroughly to remove the  shampoo.                           4.  Use CHG as you would any other liquid soap.  You can apply chg directly  to the skin and wash                       Gently with a scrungie or clean washcloth.  5.  Apply the CHG Soap to your body ONLY FROM THE NECK DOWN.   Do not use on face/ open  Wound or open sores. Avoid contact with eyes, ears mouth and genitals (private parts).                       Wash face,  Genitals (private parts) with your normal soap.             6.  Wash thoroughly, paying special attention to the area where your surgery  will be performed.  7.  Thoroughly rinse your body with warm water from the neck down.  8.  DO NOT shower/wash with your normal soap after using and rinsing off  the CHG Soap.                9.  Pat yourself dry with a clean towel.            10.  Wear clean pajamas.            11.  Place clean sheets on your bed the night of your first shower and do not  sleep with pets. Day of Surgery : Do not apply any lotions/deodorants the morning of surgery.  Please wear clean clothes to the hospital/surgery center.  FAILURE TO FOLLOW THESE INSTRUCTIONS MAY RESULT IN THE CANCELLATION OF YOUR SURGERY PATIENT SIGNATURE_________________________________  NURSE SIGNATURE__________________________________  ________________________________________________________________________

## 2015-02-01 ENCOUNTER — Encounter (HOSPITAL_COMMUNITY)
Admission: RE | Disposition: A | Discharge status: Home / Self Care | Payer: Self-pay | Source: Ambulatory Visit | Attending: General Surgery

## 2015-02-01 ENCOUNTER — Ambulatory Visit (HOSPITAL_COMMUNITY): Payer: BLUE CROSS/BLUE SHIELD | Admitting: Anesthesiology

## 2015-02-01 ENCOUNTER — Ambulatory Visit (HOSPITAL_COMMUNITY)
Admission: RE | Admit: 2015-02-01 | Discharge: 2015-02-02 | Disposition: A | Discharge status: Home / Self Care | Payer: BLUE CROSS/BLUE SHIELD | Source: Ambulatory Visit | Attending: General Surgery | Admitting: General Surgery

## 2015-02-01 ENCOUNTER — Encounter (HOSPITAL_COMMUNITY): Payer: Self-pay

## 2015-02-01 DIAGNOSIS — Z87442 Personal history of urinary calculi: Secondary | ICD-10-CM | POA: Diagnosis not present

## 2015-02-01 DIAGNOSIS — F419 Anxiety disorder, unspecified: Secondary | ICD-10-CM | POA: Diagnosis not present

## 2015-02-01 DIAGNOSIS — E780 Pure hypercholesterolemia: Secondary | ICD-10-CM | POA: Insufficient documentation

## 2015-02-01 DIAGNOSIS — Z79899 Other long term (current) drug therapy: Secondary | ICD-10-CM | POA: Insufficient documentation

## 2015-02-01 DIAGNOSIS — K219 Gastro-esophageal reflux disease without esophagitis: Secondary | ICD-10-CM | POA: Insufficient documentation

## 2015-02-01 DIAGNOSIS — F329 Major depressive disorder, single episode, unspecified: Secondary | ICD-10-CM | POA: Diagnosis not present

## 2015-02-01 DIAGNOSIS — D120 Benign neoplasm of cecum: Secondary | ICD-10-CM | POA: Insufficient documentation

## 2015-02-01 DIAGNOSIS — D126 Benign neoplasm of colon, unspecified: Secondary | ICD-10-CM | POA: Diagnosis present

## 2015-02-01 DIAGNOSIS — K388 Other specified diseases of appendix: Secondary | ICD-10-CM | POA: Diagnosis not present

## 2015-02-01 DIAGNOSIS — Z881 Allergy status to other antibiotic agents status: Secondary | ICD-10-CM | POA: Insufficient documentation

## 2015-02-01 HISTORY — PX: COLON RESECTION: SHX5231

## 2015-02-01 LAB — TYPE AND SCREEN
ABO/RH(D): O POS
ANTIBODY SCREEN: NEGATIVE

## 2015-02-01 LAB — ABO/RH: ABO/RH(D): O POS

## 2015-02-01 SURGERY — LAPAROSCOPIC RIGHT COLON RESECTION
Anesthesia: General | Site: Abdomen

## 2015-02-01 MED ORDER — HEPARIN SODIUM (PORCINE) 5000 UNIT/ML IJ SOLN
5000.0000 [IU] | Freq: Three times a day (TID) | INTRAMUSCULAR | Status: DC
  Filled 2015-02-01 (×3): qty 1

## 2015-02-01 MED ORDER — NEOSTIGMINE METHYLSULFATE 10 MG/10ML IV SOLN
INTRAVENOUS | Status: DC | PRN
  Administered 2015-02-01: 4 mg via INTRAVENOUS

## 2015-02-01 MED ORDER — MIDAZOLAM HCL 2 MG/2ML IJ SOLN
INTRAMUSCULAR | Status: AC
  Filled 2015-02-01: qty 2

## 2015-02-01 MED ORDER — MORPHINE SULFATE 2 MG/ML IJ SOLN
2.0000 mg | INTRAMUSCULAR | Status: DC | PRN
  Administered 2015-02-01: 2 mg via INTRAVENOUS
  Filled 2015-02-01: qty 1

## 2015-02-01 MED ORDER — MEPERIDINE HCL 50 MG/ML IJ SOLN: 6.2500 mg | INTRAMUSCULAR | Status: DC | PRN

## 2015-02-01 MED ORDER — ONDANSETRON HCL 4 MG/2ML IJ SOLN: 4.0000 mg | Freq: Once | INTRAMUSCULAR | Status: DC | PRN

## 2015-02-01 MED ORDER — HYDROCODONE-ACETAMINOPHEN 5-325 MG PO TABS
1.0000 | ORAL_TABLET | ORAL | Status: DC | PRN
  Administered 2015-02-02: 1 via ORAL
  Filled 2015-02-01: qty 1

## 2015-02-01 MED ORDER — ALVIMOPAN 12 MG PO CAPS
12.0000 mg | ORAL_CAPSULE | Freq: Once | ORAL | Status: AC
  Administered 2015-02-01: 12 mg via ORAL
  Filled 2015-02-01: qty 1

## 2015-02-01 MED ORDER — LIDOCAINE HCL (CARDIAC) 20 MG/ML IV SOLN
INTRAVENOUS | Status: DC | PRN
  Administered 2015-02-01: 100 mg via INTRAVENOUS

## 2015-02-01 MED ORDER — SODIUM CHLORIDE 0.9 % IJ SOLN
INTRAMUSCULAR | Status: AC
  Filled 2015-02-01: qty 10

## 2015-02-01 MED ORDER — CHLORHEXIDINE GLUCONATE CLOTH 2 % EX PADS: 6.0000 | MEDICATED_PAD | Freq: Once | CUTANEOUS | Status: DC

## 2015-02-01 MED ORDER — BUPIVACAINE HCL (PF) 0.5 % IJ SOLN
INTRAMUSCULAR | Status: AC
  Filled 2015-02-01: qty 30

## 2015-02-01 MED ORDER — ROCURONIUM BROMIDE 100 MG/10ML IV SOLN
INTRAVENOUS | Status: DC | PRN
  Administered 2015-02-01: 40 mg via INTRAVENOUS
  Administered 2015-02-01: 20 mg via INTRAVENOUS

## 2015-02-01 MED ORDER — LACTATED RINGERS IR SOLN
Status: DC | PRN
  Administered 2015-02-01: 1000 mL

## 2015-02-01 MED ORDER — ONDANSETRON HCL 4 MG/2ML IJ SOLN
INTRAMUSCULAR | Status: AC
  Filled 2015-02-01: qty 2

## 2015-02-01 MED ORDER — HYDROMORPHONE HCL 1 MG/ML IJ SOLN
0.2500 mg | INTRAMUSCULAR | Status: DC | PRN
  Administered 2015-02-01 (×2): 0.25 mg via INTRAVENOUS
  Administered 2015-02-01: 0.5 mg via INTRAVENOUS

## 2015-02-01 MED ORDER — GLYCOPYRROLATE 0.2 MG/ML IJ SOLN
INTRAMUSCULAR | Status: DC | PRN
  Administered 2015-02-01: .6 mg via INTRAVENOUS

## 2015-02-01 MED ORDER — LACTATED RINGERS IV SOLN
INTRAVENOUS | Status: DC
  Administered 2015-02-01: 1000 mL via INTRAVENOUS
  Administered 2015-02-01: 11:00:00 via INTRAVENOUS

## 2015-02-01 MED ORDER — ONDANSETRON HCL 4 MG/2ML IJ SOLN
4.0000 mg | INTRAMUSCULAR | Status: DC | PRN
  Administered 2015-02-01: 4 mg via INTRAVENOUS
  Filled 2015-02-01: qty 2

## 2015-02-01 MED ORDER — CEFOTETAN DISODIUM-DEXTROSE 2-2.08 GM-% IV SOLR
INTRAVENOUS | Status: AC
  Filled 2015-02-01: qty 50

## 2015-02-01 MED ORDER — PROPOFOL 10 MG/ML IV BOLUS
INTRAVENOUS | Status: DC | PRN
  Administered 2015-02-01: 160 mg via INTRAVENOUS

## 2015-02-01 MED ORDER — MIDAZOLAM HCL 5 MG/5ML IJ SOLN
INTRAMUSCULAR | Status: DC | PRN
  Administered 2015-02-01: 2 mg via INTRAVENOUS

## 2015-02-01 MED ORDER — ONDANSETRON HCL 4 MG PO TABS: 4.0000 mg | ORAL_TABLET | Freq: Four times a day (QID) | ORAL | Status: DC | PRN

## 2015-02-01 MED ORDER — DEXTROSE 5 % IV SOLN
2.0000 g | INTRAVENOUS | Status: AC
  Administered 2015-02-01: 2 g via INTRAVENOUS
  Filled 2015-02-01: qty 2

## 2015-02-01 MED ORDER — EPHEDRINE SULFATE 50 MG/ML IJ SOLN
INTRAMUSCULAR | Status: DC | PRN
  Administered 2015-02-01: 10 mg via INTRAVENOUS

## 2015-02-01 MED ORDER — NEOSTIGMINE METHYLSULFATE 10 MG/10ML IV SOLN
INTRAVENOUS | Status: AC
  Filled 2015-02-01: qty 1

## 2015-02-01 MED ORDER — PROPOFOL 10 MG/ML IV BOLUS
INTRAVENOUS | Status: AC
  Filled 2015-02-01: qty 20

## 2015-02-01 MED ORDER — SUFENTANIL CITRATE 50 MCG/ML IV SOLN
INTRAVENOUS | Status: DC | PRN
  Administered 2015-02-01 (×4): 10 ug via INTRAVENOUS

## 2015-02-01 MED ORDER — BUPIVACAINE HCL (PF) 0.5 % IJ SOLN
INTRAMUSCULAR | Status: DC | PRN
  Administered 2015-02-01: 10 mL

## 2015-02-01 MED ORDER — HYDROMORPHONE HCL 1 MG/ML IJ SOLN
INTRAMUSCULAR | Status: AC
  Filled 2015-02-01: qty 1

## 2015-02-01 MED ORDER — EPHEDRINE SULFATE 50 MG/ML IJ SOLN
INTRAMUSCULAR | Status: AC
  Filled 2015-02-01: qty 1

## 2015-02-01 MED ORDER — DEXTROSE 5 % IV SOLN
1.0000 g | Freq: Once | INTRAVENOUS | Status: AC
  Administered 2015-02-01: 1 g via INTRAVENOUS
  Filled 2015-02-01: qty 1

## 2015-02-01 MED ORDER — ONDANSETRON HCL 4 MG/2ML IJ SOLN
INTRAMUSCULAR | Status: DC | PRN
Start: 2015-02-01 — End: 2015-02-01
  Administered 2015-02-01: 4 mg via INTRAVENOUS

## 2015-02-01 MED ORDER — LIDOCAINE HCL (CARDIAC) 20 MG/ML IV SOLN
INTRAVENOUS | Status: AC
  Filled 2015-02-01: qty 5

## 2015-02-01 MED ORDER — GLYCOPYRROLATE 0.2 MG/ML IJ SOLN
INTRAMUSCULAR | Status: AC
  Filled 2015-02-01: qty 3

## 2015-02-01 MED ORDER — ROCURONIUM BROMIDE 100 MG/10ML IV SOLN
INTRAVENOUS | Status: AC
  Filled 2015-02-01: qty 1

## 2015-02-01 MED ORDER — KCL-LACTATED RINGERS-D5W 20 MEQ/L IV SOLN
INTRAVENOUS | Status: DC
  Administered 2015-02-01: 19:00:00 via INTRAVENOUS
  Administered 2015-02-02: 100 mL/h via INTRAVENOUS
  Filled 2015-02-01 (×3): qty 1000

## 2015-02-01 MED ORDER — SUFENTANIL CITRATE 50 MCG/ML IV SOLN
INTRAVENOUS | Status: AC
  Filled 2015-02-01: qty 1

## 2015-02-01 SURGICAL SUPPLY — 59 items
APPLIER CLIP 5 13 M/L LIGAMAX5 (MISCELLANEOUS)
APPLIER CLIP ROT 10 11.4 M/L (STAPLE)
BENZOIN TINCTURE PRP APPL 2/3 (GAUZE/BANDAGES/DRESSINGS) ×2 IMPLANT
BLADE EXTENDED COATED 6.5IN (ELECTRODE) IMPLANT
BLADE HEX COATED 2.75 (ELECTRODE) ×2 IMPLANT
CABLE HIGH FREQUENCY MONO STRZ (ELECTRODE) ×2 IMPLANT
CELLS DAT CNTRL 66122 CELL SVR (MISCELLANEOUS) IMPLANT
CLIP APPLIE 5 13 M/L LIGAMAX5 (MISCELLANEOUS) IMPLANT
CLIP APPLIE ROT 10 11.4 M/L (STAPLE) IMPLANT
CUTTER FLEX LINEAR 45M (STAPLE) ×2 IMPLANT
DECANTER SPIKE VIAL GLASS SM (MISCELLANEOUS) ×2 IMPLANT
DISSECTOR BLUNT TIP ENDO 5MM (MISCELLANEOUS) IMPLANT
DRAPE LAPAROSCOPIC ABDOMINAL (DRAPES) ×2 IMPLANT
DRAPE UTILITY XL STRL (DRAPES) ×4 IMPLANT
DRSG TEGADERM 2-3/8X2-3/4 SM (GAUZE/BANDAGES/DRESSINGS) ×2 IMPLANT
ELECT REM PT RETURN 9FT ADLT (ELECTROSURGICAL) ×2
ELECTRODE REM PT RTRN 9FT ADLT (ELECTROSURGICAL) ×1 IMPLANT
FILTER SMOKE EVAC LAPAROSHD (FILTER) IMPLANT
GAUZE SPONGE 4X4 12PLY STRL (GAUZE/BANDAGES/DRESSINGS) ×2 IMPLANT
GLOVE BIOGEL PI IND STRL 7.0 (GLOVE) ×1 IMPLANT
GLOVE BIOGEL PI INDICATOR 7.0 (GLOVE) ×1
GLOVE ECLIPSE 8.0 STRL XLNG CF (GLOVE) ×8 IMPLANT
GLOVE INDICATOR 8.0 STRL GRN (GLOVE) ×6 IMPLANT
GOWN STRL REUS W/TWL LRG LVL3 (GOWN DISPOSABLE) ×4 IMPLANT
GOWN STRL REUS W/TWL XL LVL3 (GOWN DISPOSABLE) ×8 IMPLANT
LIGASURE IMPACT 36 18CM CVD LR (INSTRUMENTS) IMPLANT
NS IRRIG 1000ML POUR BTL (IV SOLUTION) ×4 IMPLANT
PACK COLON (CUSTOM PROCEDURE TRAY) ×2 IMPLANT
RELOAD STAPLE TA45 3.5 REG BLU (ENDOMECHANICALS) ×2 IMPLANT
RTRCTR WOUND ALEXIS 18CM MED (MISCELLANEOUS)
SCISSORS LAP 5X35 DISP (ENDOMECHANICALS) ×2 IMPLANT
SET IRRIG TUBING LAPAROSCOPIC (IRRIGATION / IRRIGATOR) ×2 IMPLANT
SHEARS HARMONIC ACE PLUS 36CM (ENDOMECHANICALS) ×2 IMPLANT
SLEEVE XCEL OPT CAN 5 100 (ENDOMECHANICALS) ×4 IMPLANT
SOLUTION ANTI FOG 6CC (MISCELLANEOUS) IMPLANT
STAPLER VISISTAT 35W (STAPLE) ×2 IMPLANT
STRIP CLOSURE SKIN 1/2X4 (GAUZE/BANDAGES/DRESSINGS) ×2 IMPLANT
SUT PDS AB 1 CTX 36 (SUTURE) IMPLANT
SUT PDS AB 1 TP1 96 (SUTURE) IMPLANT
SUT PROLENE 2 0 SH DA (SUTURE) IMPLANT
SUT SILK 2 0 (SUTURE)
SUT SILK 2 0 SH CR/8 (SUTURE) IMPLANT
SUT SILK 2-0 18XBRD TIE 12 (SUTURE) IMPLANT
SUT SILK 3 0 (SUTURE)
SUT SILK 3 0 SH CR/8 (SUTURE) IMPLANT
SUT SILK 3-0 18XBRD TIE 12 (SUTURE) IMPLANT
SUT VICRYL 2 0 18  UND BR (SUTURE)
SUT VICRYL 2 0 18 UND BR (SUTURE) IMPLANT
SYS LAPSCP GELPORT 120MM (MISCELLANEOUS)
SYSTEM LAPSCP GELPORT 120MM (MISCELLANEOUS) IMPLANT
TOWEL OR NON WOVEN STRL DISP B (DISPOSABLE) ×4 IMPLANT
TRAY FOLEY W/METER SILVER 14FR (SET/KITS/TRAYS/PACK) ×2 IMPLANT
TROCAR BLADELESS OPT 5 100 (ENDOMECHANICALS) IMPLANT
TROCAR BLADELESS OPT 5 75 (ENDOMECHANICALS) ×4 IMPLANT
TROCAR XCEL BLUNT TIP 100MML (ENDOMECHANICALS) ×2 IMPLANT
TROCAR XCEL NON-BLD 11X100MML (ENDOMECHANICALS) IMPLANT
TROCAR XCEL UNIV SLVE 11M 100M (ENDOMECHANICALS) IMPLANT
TUBING INSUFFLATION 10FT LAP (TUBING) ×4 IMPLANT
YANKAUER SUCT BULB TIP NO VENT (SUCTIONS) IMPLANT

## 2015-02-01 NOTE — Anesthesia Procedure Notes (Signed)
Procedure Name: Intubation Date/Time: 02/01/2015 9:40 AM Performed by: Chyrel Masson Pre-anesthesia Checklist: Patient identified, Emergency Drugs available, Suction available, Patient being monitored and Timeout performed Patient Re-evaluated:Patient Re-evaluated prior to inductionOxygen Delivery Method: Circle system utilized and Simple face mask Preoxygenation: Pre-oxygenation with 100% oxygen Intubation Type: IV induction Ventilation: Mask ventilation without difficulty Laryngoscope Size: Mac and 3 Grade View: Grade I Tube type: Oral Tube size: 7.5 mm Number of attempts: 1 Airway Equipment and Method: Stylet Placement Confirmation: ETT inserted through vocal cords under direct vision,  positive ETCO2 and breath sounds checked- equal and bilateral Secured at: 23 cm Tube secured with: Tape Dental Injury: Teeth and Oropharynx as per pre-operative assessment

## 2015-02-01 NOTE — Transfer of Care (Signed)
Immediate Anesthesia Transfer of Care Note  Patient: Jimmy Edwards  Procedure(s) Performed: Procedure(s): LAPAROSCOPIC partial cecectomy with appendectomy (N/A)  Patient Location: PACU  Anesthesia Type:General  Level of Consciousness: awake, alert  and oriented  Airway & Oxygen Therapy: Patient Spontanous Breathing and Patient connected to face mask oxygen  Post-op Assessment: Report given to RN and Post -op Vital signs reviewed and stable  Post vital signs: Reviewed and stable  Last Vitals:  Filed Vitals:   02/01/15 1150  BP: 152/94  Pulse: 82  Temp:   Resp:     Complications: No apparent anesthesia complications

## 2015-02-01 NOTE — Anesthesia Postprocedure Evaluation (Signed)
Anesthesia Post Note  Patient: Jimmy Edwards  Procedure(s) Performed: Procedure(s) (LRB): LAPAROSCOPIC partial cecectomy with appendectomy (N/A)  Anesthesia type: general  Patient location: PACU  Post pain: Pain level controlled  Post assessment: Patient's Cardiovascular Status Stable  Last Vitals:  Filed Vitals:   02/01/15 1731  BP: 128/109  Pulse: 72  Temp: 37.1 C  Resp: 14    Post vital signs: Reviewed and stable  Level of consciousness: sedated  Complications: No apparent anesthesia complications

## 2015-02-01 NOTE — Anesthesia Preprocedure Evaluation (Signed)
Anesthesia Evaluation  Patient identified by MRN, date of birth, ID band Patient awake    Reviewed: Allergy & Precautions, NPO status , Patient's Chart, lab work & pertinent test results  Airway Mallampati: I  TM Distance: >3 FB Neck ROM: Full    Dental   Pulmonary    Pulmonary exam normal       Cardiovascular Normal cardiovascular exam    Neuro/Psych    GI/Hepatic GERD-  Controlled and Medicated,  Endo/Other    Renal/GU      Musculoskeletal   Abdominal   Peds  Hematology   Anesthesia Other Findings   Reproductive/Obstetrics                             Anesthesia Physical Anesthesia Plan  ASA: II  Anesthesia Plan: General   Post-op Pain Management:    Induction: Intravenous  Airway Management Planned: Oral ETT  Additional Equipment:   Intra-op Plan:   Post-operative Plan: Extubation in OR  Informed Consent: I have reviewed the patients History and Physical, chart, labs and discussed the procedure including the risks, benefits and alternatives for the proposed anesthesia with the patient or authorized representative who has indicated his/her understanding and acceptance.     Plan Discussed with: CRNA and Surgeon  Anesthesia Plan Comments:         Anesthesia Quick Evaluation

## 2015-02-01 NOTE — Op Note (Signed)
Operative Note  Jimmy Edwards male 54 y.o. 02/01/2015  PREOPERATIVE DX:  Tubular adenoma of appendiceal orifice  POSTOPERATIVE DX:  Same  PROCEDURE:   Laparoscopic partial cecectomy with appendectomy         Surgeon: Odis Hollingshead   Assistants: none  Anesthesia: General endotracheal anesthesia  Indications:   This is a 54 year old male who underwent colonoscopy and was discovered to have a 2-3 cm sessile type polyp extruding from the appendiceal orifice. A fair mild polyp went into the appendiceal orifice. Biopsy was performed and this was consistent with tubular adenoma. It could not be completely removed by way of colonoscopic intervention. He now presents for the above procedure.    Procedure Detail:  He was brought to the operating room and placed supine on the operating table and general anesthetic was given. The hair on the abdominal wall was clipped. A Foley catheter was inserted. The abdominal wall was widely sterilely prepped and draped. A timeout was performed.  Marcaine was infiltrated in the supra umbilical region. A small supraumbilical incision was made down to the level of the fascia. The fascia and peritoneum were then divided sharply and the peritoneal cavity entered under direct vision. A pursestring suture of 0 Vicryl was placed around the edges of the fascia. A Hassan trocar was introduced into the peritoneal cavity and a pneumoperitoneum created by insufflation of CO2 gas. The area underneath the laparoscope was inspected and there is no evidence of bleeding or organ injury.  A 5 mm trocar was placed in the left lower quadrant. A 5 mm trocar was placed in the epigastric area. The cecum was identified in the lateral aspect was quite mobile. The appendix was identified and the mesial appendix was dissected down toward the base of the cecum. The antimesenteric fat pad  On the distal ileum was identified and it was mobilized free from the ileum. I identified the  ileocecal valve area. I mobilized the posterior aspect of the cecum to be able to retract it anteriorly. I then performed a partial cecectomy and appendectomy using a linear cutting stapler making sure not to compromise the ileocecal valve area. I was not able to get a large amount of the cecum given the proximity of the appendix to the ileocecal valve area.  The specimen was taken to the back table. I partially removed the staple line and once I did a sessile lesion came extruding out of the appendiceal orifice. There were some polypoid lesion as well close to the staple line. Frozen section analysis demonstrated a tubular adenoma with no frozen section evidence of malignancy or high-grade dysplasia.  I was not 938% certain that the entire polyp was removed, but further surgery would have required an ileocecostomy. I spoke with Dr. Hilarie Fredrickson on the phone who felt he would have a good chance of removing any residual polypoid tissue 3-6 months in the future via colonoscopy. At that point, I decided not to proceed with the ileocecectomy which allowed for preservation of the ileocecal valve and avoidance of an anastomosis.. The patient and I had also discussed this plan of action preoperatively.  I irrigated out the right lower quadrant area and inspected the staple line. There is no evidence of bleeding or leak from the staple line. The left lower quadrant trocar was removed. The supraumbilical trocar was removed and the fascial defect closed by tying down the pursestring suture under laparoscopic vision. The CO2 gas was released through the remaining epigastric trocar and then it was  removed.  All skin incisions were closed with 4-0 Monocryl subcuticular stitches. Steri-Strips and sterile dressings were applied. He tolerated the procedure well without any apparent complications and was taken to the recovery room in satisfactory condition.   Estimated Blood Loss:  100 ml         Specimens: appendix and part of  cecum        Complications:  * No complications entered in OR log *         Disposition: PACU - hemodynamically stable.         Condition: stable

## 2015-02-01 NOTE — H&P (Signed)
History of Present Illness Patient words: Appendiceal polyp.  The patient is a 54 year old male   Note:He is referred by Dr. Hilarie Fredrickson because of a 2-3 cm adenomatous polyp found at the appendiceal orifice on screening colonoscopy. A biopsy was performed and demonstrated a tubular adenoma. The polyp appeared to originate from the appendiceal lumen. He no abdominal complaints. His bowel habits have not changed. The polyp was unable to be completely removed via colonoscopy.   Other Problems  Anxiety Disorder Depression Gastroesophageal Reflux Disease Hemorrhoids Hypercholesterolemia Kidney Stone  Past Surgical History) No pertinent past surgical history    Allergies  Cleocin *ANTI-INFECTIVE AGENTS - MISC.* Tetracycline HCl *TETRACYCLINES*   Prior to Admission medications   Medication Sig Start Date End Date Taking? Authorizing Provider  diazepam (VALIUM) 5 MG tablet Take 5-10 mg by mouth daily as needed for anxiety. For anxiety   Yes Historical Provider, MD  escitalopram (LEXAPRO) 20 MG tablet Take 20 mg by mouth daily.    Yes Historical Provider, MD  metroNIDAZOLE (FLAGYL) 500 MG tablet Take 500 mg by mouth as directed. At 1400, 1500, 2200 day before procedure   Yes Historical Provider, MD  Multiple Vitamin (MULTIVITAMIN WITH MINERALS) TABS Take 1 tablet by mouth daily.   Yes Historical Provider, MD  neomycin (MYCIFRADIN) 500 MG tablet Take 1,000 mg by mouth See admin instructions. At 1400, 1500, 2200 day before surgery   Yes Historical Provider, MD  omeprazole (PRILOSEC) 20 MG capsule Take 20 mg by mouth daily.   Yes Historical Provider, MD  promethazine (PHENERGAN) 25 MG tablet Take 25 mg by mouth every 6 (six) hours as needed for nausea or vomiting.   Yes Historical Provider, MD  simvastatin (ZOCOR) 20 MG tablet Take 20 mg by mouth daily.    Yes Historical Provider, MD  Soft Lens Products (REFRESH CONTACTS DROPS) SOLN Place 1 drop into both eyes 2 (two) times daily  as needed (dry eyes).   Yes Historical Provider, MD     Social History  Caffeine use Carbonated beverages, Tea. No alcohol use No drug use Tobacco use Never smoker.  Family History  Heart Disease Father. Migraine Headache Father.  Review of Systems  General Not Present- Appetite Loss, Chills, Fatigue, Fever, Night Sweats, Weight Gain and Weight Loss. Skin Not Present- Change in Wart/Mole, Dryness, Hives, Jaundice, New Lesions, Non-Healing Wounds, Rash and Ulcer. HEENT Present- Ringing in the Ears and Wears glasses/contact lenses. Not Present- Earache, Hearing Loss, Hoarseness, Nose Bleed, Oral Ulcers, Seasonal Allergies, Sinus Pain, Sore Throat, Visual Disturbances and Yellow Eyes. Respiratory Not Present- Bloody sputum, Chronic Cough, Difficulty Breathing, Snoring and Wheezing. Breast Not Present- Breast Mass, Breast Pain, Nipple Discharge and Skin Changes. Cardiovascular Not Present- Chest Pain, Difficulty Breathing Lying Down, Leg Cramps, Palpitations, Rapid Heart Rate, Shortness of Breath and Swelling of Extremities. Gastrointestinal Present- Hemorrhoids. Not Present- Abdominal Pain, Bloating, Bloody Stool, Change in Bowel Habits, Chronic diarrhea, Constipation, Difficulty Swallowing, Excessive gas, Gets full quickly at meals, Indigestion, Nausea, Rectal Pain and Vomiting. Male Genitourinary Not Present- Blood in Urine, Change in Urinary Stream, Frequency, Impotence, Nocturia, Painful Urination, Urgency and Urine Leakage. Musculoskeletal Not Present- Back Pain, Joint Pain, Joint Stiffness, Muscle Pain, Muscle Weakness and Swelling of Extremities. Neurological Not Present- Decreased Memory, Fainting, Headaches, Numbness, Seizures, Tingling, Tremor, Trouble walking and Weakness. Psychiatric Present- Anxiety and Depression. Not Present- Bipolar, Change in Sleep Pattern, Fearful and Frequent crying. Endocrine Not Present- Cold Intolerance, Excessive Hunger, Hair Changes, Heat  Intolerance, Hot flashes and  New Diabetes. Hematology Not Present- Easy Bruising, Excessive bleeding, Gland problems, HIV and Persistent Infections.    Physical Exam  The physical exam findings are as follows: Note:General: WDWN in NAD. Pleasant and cooperative. His mother is with him. She worked as an Therapist, sports in the past.  HEENT: [place]/AT, no facial masses  EYES: EOMI, no icterus  NECK: Supple, no obvious mass or thyroid enlargement.  CV: RRR, no murmur, no JVD.  CHEST: Breath sounds equal and clear. Respirations nonlabored.  ABDOMEN: Soft, nontender, nondistended, no masses, no organomegaly, active bowel sounds, no scars, no hernias.  MUSCULOSKELETAL: FROM, good muscle tone, no edema, no venous stasis changes  LYMPHATIC: No palpable cervical, supraclavicular adenopathy  SKIN: No jaundice or suspicious rashes.  NEUROLOGIC: Alert and oriented, answers questions appropriately, normal gait and station.  PSYCHIATRIC: Normal mood, affect , and behavior.    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 We discussed surgical options. I recommended starting with a laparoscopic cecectomy and appendectomy in block as long as the polyp can be completely removed this way without compromising the ileocecal valve. If the polyp is unable to be completely removed then would proceed with partial colectomy during the same operation. If the polyp was able to be removed with the cecectomy/appendectomy and was malignant on final pathology, then he would need an interval right colectomy and well as preoperative CTs for staging.  Plan: Will proceed as outlined above. I have explained the procedure and risks of colon resection. Risks include but are not limited to bleeding, infection, wound problems, anesthesia, anastomotic leak, need for colostomy, need for reoperative surgery, injury to intraabominal organs (such as intestine,  spleen, kidney, bladder, ureter, etc.), ileus, irregular bowel habits. He seems to understand and agrees with the plan.  Jackolyn Confer, MD

## 2015-02-01 NOTE — Interval H&P Note (Signed)
History and Physical Interval Note:  02/01/2015 9:19 AM  [name]  has presented today for surgery, with the diagnosis of TUBULAR ADENOMA OF COLON  The various methods of treatment have been discussed with the patient and family. After consideration of risks, benefits and other options for treatment, the patient has consented to  Procedure(s): LAPAROSCOPIC CECECTOMY POSSIBLE PARTIAL COLECTOMY (N/A) as a surgical intervention .  The patient's history has been reviewed, patient examined, no change in status, stable for surgery.  I have reviewed the patient's chart and labs.  Questions were answered to the patient's satisfaction.     Jimmy Edwards Jimmy Edwards

## 2015-02-02 ENCOUNTER — Telehealth: Payer: Self-pay

## 2015-02-02 ENCOUNTER — Encounter (HOSPITAL_COMMUNITY): Payer: Self-pay | Admitting: General Surgery

## 2015-02-02 DIAGNOSIS — D120 Benign neoplasm of cecum: Secondary | ICD-10-CM | POA: Diagnosis not present

## 2015-02-02 MED ORDER — ACETAMINOPHEN-CODEINE #2 300-15 MG PO TABS: 1.0000 | ORAL_TABLET | ORAL | Status: DC | PRN

## 2015-02-02 NOTE — Discharge Instructions (Signed)
[facility] ______[facility] LAPAROSCOPIC SURGERY: POST OP INSTRUCTIONS Always review your discharge instruction sheet given to you by the facility where your surgery was performed. IF YOU HAVE DISABILITY OR FAMILY LEAVE FORMS, YOU MUST BRING THEM TO THE OFFICE FOR PROCESSING.   DO NOT GIVE THEM TO YOUR DOCTOR.  1. A prescription for pain medication may be given to you upon discharge.  Take your pain medication as prescribed, if needed.  If narcotic pain medicine is not needed, then you may take acetaminophen (Tylenol) or ibuprofen (Advil) as needed. 2. Take your usually prescribed medications unless otherwise directed. 3. If you need a refill on your pain medication, please contact your pharmacy.  They will contact our office to request authorization. Prescriptions will not be filled after 5pm or on week-ends. 4. You should follow a light diet the first few days after arrival home, such as soup and crackers, etc.  Be sure to include lots of fluids daily. 5. Most patients will experience some swelling and bruising in the area of the incisions.  Ice packs will help.  Swelling and bruising can take several days to resolve.  6. It is common to experience some constipation if taking pain medication after surgery.  Increasing fluid intake and taking a stool softener (such as Colace) will usually help or prevent this problem from occurring.  A mild laxative (Milk of Magnesia or Miralax) should be taken according to package instructions if there are no bowel movements after 48 hours. 7. Unless discharge instructions indicate otherwise, you may remove your bandages 72 hours after surgery, and you may shower at that time.  You may have steri-strips (small skin tapes) in place directly over the incision.  These strips should be left on the skin.  If your surgeon used skin glue on the incision, you may shower in 24 hours.  The glue will flake off over the next 2-3 weeks.  Any sutures or staples will be  removed at the office during your follow-up visit. 8. ACTIVITIES:  You may resume regular (light) daily activities beginning the next day--such as daily self-care, walking, climbing stairs--gradually increasing activities as tolerated.  You may have sexual intercourse when it is comfortable.  Refrain from any heavy lifting or straining-nothing over 10 pounds for 2 weeks. a. You may drive when you are no longer taking prescription pain medication, you can comfortably wear a seatbelt, and you can safely maneuver your car and apply brakes. b. RETURN TO WORK:   Desk work in 5-7 days if comfortable.  Full activity after 2 weeks if pain-free.__________________________________________________________ 9. You should see your doctor in the office for a follow-up appointment approximately 2-3 weeks after your surgery.  Make sure that you call for this appointment within a day or two after you arrive home to insure a convenient appointment time. 10. OTHER INSTRUCTIONS: __________________________________________________________________________________________________________________________ __________________________________________________________________________________________________________________________ WHEN TO CALL YOUR DOCTOR: 1. Fever over 101.0 2. Inability to urinate 3. Continued bleeding from incision. 4. Increased pain, redness, or drainage from the incision. 5. Increasing abdominal pain  The clinic staff is available to answer your questions during regular business hours.  Please dont hesitate to call and ask to speak to one of the nurses for clinical concerns.  If you have a medical emergency, go to the nearest emergency room or call 911.  A surgeon from [facility] is always on call at the hospital. [address] [place], [place], [place]  [address] ? [address] [address], [place], [place]  [address] [phone] ? [phone] ? FAX [phone] Web site:  [email or web address]

## 2015-02-02 NOTE — Telephone Encounter (Signed)
Recall adjusted for October 2016.

## 2015-02-02 NOTE — Telephone Encounter (Signed)
-----   Message from Jerene Bears, MD sent at 02/01/2015  4:27 PM EDT ----- Needs recall colonoscopy for oct 2016 -- cecal polyp, s/p appendectomy to check anastomosis for residual polyp per Dr. Zella Richer Thanks JMP

## 2015-02-02 NOTE — Progress Notes (Signed)
1 Day Post-Op  Subjective: Periumbilical soreness.  Objective: Vital signs in last 24 hours: Temp:  [97.8 F (36.6 C)-98.8 F (37.1 C)] 98.2 F (36.8 C) (06/08 0522) Pulse Rate:  [50-82] 50 (06/08 0522) Resp:  [6-16] 16 (06/08 0522) BP: (113-152)/(67-109) 120/74 mmHg (06/08 0522) SpO2:  [99 %-100 %] 100 % (06/08 0522)    Intake/Output from previous day: 06/07 0701 - 06/08 0700 In: 2770 [I.V.:2770] Out: 850 [Urine:850] Intake/Output this shift:    PE: General- In NAD Abdomen-soft, dressings dry  Lab Results:  No results for input(s): WBC, HGB, HCT, PLT in the last 72 hours. BMET No results for input(s): NA, K, CL, CO2, GLUCOSE, BUN, CREATININE, CALCIUM in the last 72 hours. PT/INR No results for input(s): LABPROT, INR in the last 72 hours. Comprehensive Metabolic Panel:    Component Value Date/Time   NA 139 01/27/2015 1350   NA 139 05/23/2012 2200   K 4.8 01/27/2015 1350   K 4.1 05/23/2012 2200   CL 102 01/27/2015 1350   CL 99 05/23/2012 2200   CO2 29 01/27/2015 1350   CO2 32 05/23/2012 2200   BUN 21* 01/27/2015 1350   BUN 12 05/23/2012 2200   CREATININE 1.01 01/27/2015 1350   CREATININE 0.97 05/23/2012 2200   GLUCOSE 87 01/27/2015 1350   GLUCOSE 97 05/23/2012 2200   CALCIUM 9.7 01/27/2015 1350   CALCIUM 10.0 05/23/2012 2200   AST 30 01/27/2015 1350   AST 24 05/23/2012 2200   ALT 26 01/27/2015 1350   ALT 20 05/23/2012 2200   ALKPHOS 77 01/27/2015 1350   ALKPHOS 81 05/23/2012 2200   BILITOT 0.4 01/27/2015 1350   BILITOT 0.3 05/23/2012 2200   PROT 7.1 01/27/2015 1350   PROT 7.7 05/23/2012 2200   ALBUMIN 4.1 01/27/2015 1350   ALBUMIN 4.1 05/23/2012 2200     Studies/Results: No results found.  Anti-infectives: Anti-infectives    Start     Dose/Rate Route Frequency Ordered Stop   02/01/15 1730  cefoTEtan (CEFOTAN) 1 g in dextrose 5 % 50 mL IVPB     1 g 100 mL/hr over 30 Minutes Intravenous  Once 02/01/15 1639 02/01/15 1940   02/01/15 0731   cefoTEtan (CEFOTAN) 2 g in dextrose 5 % 50 mL IVPB     2 g 100 mL/hr over 30 Minutes Intravenous On call to O.R. 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  Instructions given.   Jimmy Edwards 02/02/2015

## 2015-02-04 NOTE — Discharge Summary (Signed)
Physician Discharge Summary  Patient ID: Jimmy Edwards MRN: [id number] DOB/AGE: 01-11-1961 54 y.o.  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  Final pathology demonstrated a tubular adenoma involving the staple line.  He was able to be discharged on POD #1.  Discharge instructions were given to him.  He will need repeat colonoscopy in 3-6 months to make sure there is no residue polyp tissue.  Discharge Exam: Blood pressure 143/87, pulse 50, temperature 97.7 F (36.5 C), temperature source Oral, resp. rate 16, height 5\' 8"  (1.727 m), weight 74.844 kg (165 lb), SpO2 100 %.   Disposition: 01-Home or Self Care     Medication List    STOP taking these medications        REFRESH CONTACTS DROPS Soln     simvastatin 20 MG tablet  Commonly known as:  ZOCOR      TAKE these medications        acetaminophen-codeine 300-15 MG per tablet  Commonly known as:  TYLENOL #2  Take 1 tablet by mouth every 4 (four) hours as needed for moderate pain.     diazepam 5 MG tablet  Commonly known as:  VALIUM  Take 5-10 mg by mouth daily as needed for anxiety. For anxiety     escitalopram 20 MG tablet  Commonly known as:  LEXAPRO  Take 20 mg by mouth daily.     metroNIDAZOLE 500 MG tablet  Commonly known as:  FLAGYL  Take 500 mg by mouth as directed. At 1400, 1500, 2200 day before procedure     multivitamin with minerals Tabs tablet  Take 1 tablet by mouth daily.     neomycin 500 MG tablet  Commonly known as:  MYCIFRADIN  Take 1,000 mg by mouth See admin instructions. At 1400, 1500, 2200 day before surgery     omeprazole 20 MG capsule  Commonly known as:  PRILOSEC   Take 20 mg by mouth daily.     promethazine 25 MG tablet  Commonly known as:  PHENERGAN  Take 25 mg by mouth every 6 (six) hours as needed for nausea or vomiting.         Signed: Odis Hollingshead 02/04/2015, 11:34 AM

## 2015-02-17 ENCOUNTER — Inpatient Hospital Stay (HOSPITAL_COMMUNITY)
Admission: EM | Admit: 2015-02-17 | Discharge: 2015-02-19 | DRG: 301 | Disposition: A | Discharge status: Home / Self Care | Payer: BLUE CROSS/BLUE SHIELD | Attending: Internal Medicine | Admitting: Internal Medicine

## 2015-02-17 ENCOUNTER — Encounter (HOSPITAL_COMMUNITY): Payer: Self-pay | Admitting: *Deleted

## 2015-02-17 DIAGNOSIS — Z881 Allergy status to other antibiotic agents status: Secondary | ICD-10-CM

## 2015-02-17 DIAGNOSIS — K219 Gastro-esophageal reflux disease without esophagitis: Secondary | ICD-10-CM | POA: Diagnosis present

## 2015-02-17 DIAGNOSIS — Z79891 Long term (current) use of opiate analgesic: Secondary | ICD-10-CM

## 2015-02-17 DIAGNOSIS — I808 Phlebitis and thrombophlebitis of other sites: Secondary | ICD-10-CM | POA: Diagnosis not present

## 2015-02-17 DIAGNOSIS — Z801 Family history of malignant neoplasm of trachea, bronchus and lung: Secondary | ICD-10-CM

## 2015-02-17 DIAGNOSIS — M7989 Other specified soft tissue disorders: Secondary | ICD-10-CM | POA: Diagnosis present

## 2015-02-17 DIAGNOSIS — L03114 Cellulitis of left upper limb: Secondary | ICD-10-CM

## 2015-02-17 DIAGNOSIS — Z8249 Family history of ischemic heart disease and other diseases of the circulatory system: Secondary | ICD-10-CM | POA: Diagnosis not present

## 2015-02-17 DIAGNOSIS — D120 Benign neoplasm of cecum: Secondary | ICD-10-CM | POA: Diagnosis present

## 2015-02-17 DIAGNOSIS — E785 Hyperlipidemia, unspecified: Secondary | ICD-10-CM | POA: Diagnosis present

## 2015-02-17 DIAGNOSIS — F329 Major depressive disorder, single episode, unspecified: Secondary | ICD-10-CM | POA: Diagnosis present

## 2015-02-17 DIAGNOSIS — Z79899 Other long term (current) drug therapy: Secondary | ICD-10-CM

## 2015-02-17 DIAGNOSIS — Z87442 Personal history of urinary calculi: Secondary | ICD-10-CM

## 2015-02-17 DIAGNOSIS — Z8371 Family history of colonic polyps: Secondary | ICD-10-CM

## 2015-02-17 DIAGNOSIS — F419 Anxiety disorder, unspecified: Secondary | ICD-10-CM | POA: Diagnosis present

## 2015-02-17 LAB — COMPREHENSIVE METABOLIC PANEL
ALK PHOS: 83 U/L (ref 38–126)
ALT: 21 U/L (ref 17–63)
AST: 22 U/L (ref 15–41)
Albumin: 4.3 g/dL (ref 3.5–5.0)
Anion gap: 9 (ref 5–15)
BUN: 19 mg/dL (ref 6–20)
CO2: 26 mmol/L (ref 22–32)
Calcium: 9.6 mg/dL (ref 8.9–10.3)
Chloride: 103 mmol/L (ref 101–111)
Creatinine, Ser: 0.98 mg/dL (ref 0.61–1.24)
GFR calc Af Amer: >60 mL/min (ref >60)
GFR calc non Af Amer: >60 mL/min (ref >60)
GLUCOSE: 101 mg/dL — AB (ref 65–99)
Potassium: 3.8 mmol/L (ref 3.5–5.1)
SODIUM: 138 mmol/L (ref 135–145)
TOTAL PROTEIN: 7.8 g/dL (ref 6.5–8.1)
Total Bilirubin: 0.6 mg/dL (ref 0.3–1.2)

## 2015-02-17 LAB — CBC WITH DIFFERENTIAL/PLATELET
BASOS PCT: 0 % (ref 0–1)
Basophils Absolute: 0.1 10*3/uL (ref 0.0–0.1)
EOS ABS: 0.3 10*3/uL (ref 0.0–0.7)
EOS PCT: 2 % (ref 0–5)
HCT: 44.2 % (ref 39.0–52.0)
Hemoglobin: 15.3 g/dL (ref 13.0–17.0)
LYMPHS ABS: 1.9 10*3/uL (ref 0.7–4.0)
Lymphocytes Relative: 17 % (ref 12–46)
MCH: 30.5 pg (ref 26.0–34.0)
MCHC: 34.6 g/dL (ref 30.0–36.0)
MCV: 88 fL (ref 78.0–100.0)
Monocytes Absolute: 0.9 10*3/uL (ref 0.1–1.0)
Monocytes Relative: 8 % (ref 3–12)
Neutro Abs: 8.3 10*3/uL — ABNORMAL HIGH (ref 1.7–7.7)
Neutrophils Relative %: 73 % (ref 43–77)
Platelets: 213 10*3/uL (ref 150–400)
RBC: 5.02 MIL/uL (ref 4.22–5.81)
RDW: 12.4 % (ref 11.5–15.5)
WBC: 11.5 10*3/uL — ABNORMAL HIGH (ref 4.0–10.5)

## 2015-02-17 MED ORDER — DIAZEPAM 5 MG PO TABS: 5.0000 mg | ORAL_TABLET | Freq: Every day | ORAL | Status: DC | PRN

## 2015-02-17 MED ORDER — ENOXAPARIN SODIUM 40 MG/0.4ML ~~LOC~~ SOLN
40.0000 mg | SUBCUTANEOUS | Status: DC
  Administered 2015-02-19: 40 mg via SUBCUTANEOUS
  Filled 2015-02-17 (×2): qty 0.4

## 2015-02-17 MED ORDER — PANTOPRAZOLE SODIUM 40 MG PO TBEC
40.0000 mg | DELAYED_RELEASE_TABLET | Freq: Every day | ORAL | Status: DC
  Filled 2015-02-17 (×2): qty 1

## 2015-02-17 MED ORDER — ACETAMINOPHEN 325 MG PO TABS: 650.0000 mg | ORAL_TABLET | Freq: Four times a day (QID) | ORAL | Status: DC | PRN

## 2015-02-17 MED ORDER — SIMVASTATIN 20 MG PO TABS
20.0000 mg | ORAL_TABLET | Freq: Every day | ORAL | Status: DC
  Filled 2015-02-17 (×2): qty 1

## 2015-02-17 MED ORDER — VANCOMYCIN HCL IN DEXTROSE 1-5 GM/200ML-% IV SOLN
1000.0000 mg | Freq: Two times a day (BID) | INTRAVENOUS | Status: DC
  Administered 2015-02-18 – 2015-02-19 (×3): 1000 mg via INTRAVENOUS
  Filled 2015-02-17 (×3): qty 200

## 2015-02-17 MED ORDER — ENOXAPARIN SODIUM 120 MG/0.8ML ~~LOC~~ SOLN
110.0000 mg | Freq: Once | SUBCUTANEOUS | Status: AC
  Administered 2015-02-17: 110 mg via SUBCUTANEOUS
  Filled 2015-02-17: qty 2

## 2015-02-17 MED ORDER — VANCOMYCIN HCL IN DEXTROSE 1-5 GM/200ML-% IV SOLN
1000.0000 mg | INTRAVENOUS | Status: AC
  Administered 2015-02-17: 1000 mg via INTRAVENOUS
  Filled 2015-02-17: qty 200

## 2015-02-17 MED ORDER — DEXTROSE 5 % IV SOLN
1.0000 g | INTRAVENOUS | Status: DC
  Administered 2015-02-17 – 2015-02-18 (×2): 1 g via INTRAVENOUS
  Filled 2015-02-17 (×3): qty 10

## 2015-02-17 MED ORDER — POLYETHYLENE GLYCOL 3350 17 G PO PACK: 17.0000 g | PACK | Freq: Every day | ORAL | Status: DC | PRN

## 2015-02-17 MED ORDER — ACETAMINOPHEN 650 MG RE SUPP
650.0000 mg | Freq: Four times a day (QID) | RECTAL | Status: DC | PRN
Start: 2015-02-17 — End: 2015-02-19

## 2015-02-17 MED ORDER — HYDROCODONE-ACETAMINOPHEN 5-325 MG PO TABS: 1.0000 | ORAL_TABLET | ORAL | Status: DC | PRN

## 2015-02-17 MED ORDER — IBUPROFEN 200 MG PO TABS
200.0000 mg | ORAL_TABLET | Freq: Three times a day (TID) | ORAL | Status: DC
  Administered 2015-02-17: 200 mg via ORAL
  Filled 2015-02-17 (×7): qty 1

## 2015-02-17 MED ORDER — ESCITALOPRAM OXALATE 20 MG PO TABS
20.0000 mg | ORAL_TABLET | Freq: Every day | ORAL | Status: DC
  Filled 2015-02-17 (×2): qty 1

## 2015-02-17 MED ORDER — BISACODYL 10 MG RE SUPP: 10.0000 mg | Freq: Every day | RECTAL | Status: DC | PRN

## 2015-02-17 MED ORDER — ADULT MULTIVITAMIN W/MINERALS CH
1.0000 | ORAL_TABLET | Freq: Every day | ORAL | Status: DC
  Filled 2015-02-17 (×2): qty 1

## 2015-02-17 MED ORDER — SODIUM CHLORIDE 0.9 % IV SOLN
INTRAVENOUS | Status: DC
  Administered 2015-02-17: 20:00:00 via INTRAVENOUS
  Administered 2015-02-18: 1000 mL via INTRAVENOUS
  Administered 2015-02-18 – 2015-02-19 (×2): 500 mL via INTRAVENOUS

## 2015-02-17 MED ORDER — ONDANSETRON HCL 4 MG PO TABS: 4.0000 mg | ORAL_TABLET | Freq: Four times a day (QID) | ORAL | Status: DC | PRN

## 2015-02-17 MED ORDER — ONDANSETRON HCL 4 MG/2ML IJ SOLN: 4.0000 mg | Freq: Four times a day (QID) | INTRAMUSCULAR | Status: DC | PRN

## 2015-02-17 NOTE — Progress Notes (Signed)
ANTIBIOTIC CONSULT NOTE - INITIAL  Pharmacy Consult for Vancomycin Indication: Thrombophlebitis  Allergies  Allergen Reactions  . Cleocin [Clindamycin Hcl] Other (See Comments)    Chest pressure and tightness  . Tetracyclines & Related Other (See Comments)    Chest pressure and tightness    Patient Measurements: Weight: 165 lb (74.844 kg)  Vital Signs: Temp: 98.2 F (36.8 C) (06/23 1317) Temp Source: Oral (06/23 1317) BP: 136/97 mmHg (06/23 1730) Pulse Rate: 69 (06/23 1730) Intake/Output from previous day:   Intake/Output from this shift:    Labs:  Recent Labs  02/17/15 1433  WBC 11.5*  HGB 15.3  PLT 213  CREATININE 0.98   Estimated Creatinine Clearance: 82.4 mL/min (by C-G formula based on Cr of 0.98). No results for input(s): VANCOTROUGH, VANCOPEAK, VANCORANDOM, GENTTROUGH, GENTPEAK, GENTRANDOM, TOBRATROUGH, TOBRAPEAK, TOBRARND, AMIKACINPEAK, AMIKACINTROU, AMIKACIN in the last 72 hours.   Microbiology: No results found for this or any previous visit (from the past 720 hour(s)).  Medical History: Past Medical History  Diagnosis Date  . Depression     takes lexapro  . Left ureteral calculus   . Hyperlipidemia   . GERD (gastroesophageal reflux disease)   . Anxiety   . History of hiatal hernia     Medications:  Scheduled:   Infusions:  . cefTRIAXone (ROCEPHIN)  IV    . vancomycin     PRN:   Assessment: 54 yo male presents with presumed thrombophlebitis of his left hand. Pharmacy is consulted to dose vancomycin.  6/23 >> Vancomycin >> 6/23 >> Rocephin (MD) >>  Tmax: Afebrile WBC: 11.5k Renal: SCr 0.98, CrCl 82 ml/min CG, 86 ml/min/1.18m2 (normalized)  No cultures ordered  Goal of Therapy:  Vancomycin trough level 10-15 mcg/ml  Plan:   Vancomycin 1g IV q12h Check trough at steady state Follow up renal function & cultures, clinical course  Peggyann Juba, PharmD, BCPS Pager: [phone] 02/17/2015,6:06 PM

## 2015-02-17 NOTE — H&P (Signed)
Patient Demographics  Graeme Menees, is a 54 y.o. male  MRN: [id number]   DOB - 1960/11/24  Admit Date - 02/17/2015  Outpatient Primary MD for the patient is Jerlyn Ly, MD   With History of -  Past Medical History  Diagnosis Date  . Depression     takes lexapro  . Left ureteral calculus   . Hyperlipidemia   . GERD (gastroesophageal reflux disease)   . Anxiety   . History of hiatal hernia       Past Surgical History  Procedure Laterality Date  . Extracorporeal shock wave lithotripsy Left 02-09-2013  . Colonscopy     . Upper gi endoscopy    . Root canal    . Colon resection N/A 02/01/2015    Procedure: LAPAROSCOPIC partial cecectomy with appendectomy;  Surgeon: Jackolyn Confer, MD;  Location: [facility];  Service: General;  Laterality: N/A;    in for   Chief Complaint  Patient presents with  . arm infection      HPI  Quindell Shere  is a 54 y.o. 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 02/01/15 By Dr Zella Richer, patient presents from his PCP office for positive left upper venous Doppler for thrombophlebitis, patient developed thrombophlebitis secondary to IV axis post surgery, given IM Rocephin and by mouth doxycycline by his PCP yesterday, without much improvement, patient had left upper extremity venous Doppler which confirmed cephalic thrombophlebitis from  anti-cubital fossa to mid forearm, patient was sent by PCP to ED for further management, patient received one dose of therapeutic Lovenox in ED, did on IV vancomycin and Rocephin, she denies any fever chills, chest pain shortness of breath.    Review of Systems    In addition to the HPI above,  No Fever-chills, No Headache, No changes with Vision or hearing, No problems swallowing food or Liquids, No Chest pain, Cough or Shortness of Breath, No Abdominal pain, No Nausea or Vommitting, Bowel movements are regular, No Blood in  stool or Urine, No dysuria,  left arm redness and erythema. No new joints pains-aches,  No new weakness, tingling, numbness in any extremity, No recent weight gain or loss, No polyuria, polydypsia or polyphagia, No significant Mental Stressors.  A full 10 point Review of Systems was done, except as stated above, all other Review of Systems were negative.   Social History History  Substance Use Topics  . Smoking status: Never Smoker   . Smokeless tobacco: Never Used  . Alcohol Use: 0.0 oz/week    0 Standard drinks or equivalent per week     Comment: Rarely     Family History Family History  Problem Relation Age of Onset  . Lung cancer Maternal Grandmother   . Colon cancer Neg Hx   . Colon polyps Mother   . Heart disease Father   . Heart disease Maternal Grandmother   . Esophageal cancer Neg Hx   . Kidney disease Neg Hx   . Diabetes Neg Hx   . Gallbladder disease Neg Hx      Prior to Admission medications   Medication Sig Start Date End Date Taking? Authorizing Provider  acetaminophen (TYLENOL) 500 MG tablet Take 1,000 mg by mouth every 6 (six) hours as needed for moderate pain (pain).   Yes Historical Provider, MD  acetaminophen-codeine (TYLENOL #2) 300-15 MG per tablet Take 1 tablet by mouth every 4 (four) hours as needed for moderate pain. 02/02/15  Yes Jackolyn Confer, MD  diazepam (VALIUM) 5 MG tablet Take 5 mg by mouth daily as needed for anxiety (anxiety). For anxiety   Yes Historical Provider, MD  escitalopram (LEXAPRO) 20 MG tablet Take 20 mg by mouth daily.    Yes Historical Provider, MD  Multiple Vitamin (MULTIVITAMIN WITH MINERALS) TABS Take 1 tablet by mouth daily.   Yes Historical Provider, MD  omeprazole (PRILOSEC) 20 MG capsule Take 20 mg by mouth daily.   Yes Historical Provider, MD  simvastatin (ZOCOR) 20 MG tablet Take 20 mg by mouth daily.   Yes Historical Provider, MD    Allergies  Allergen Reactions  . Cleocin [Clindamycin Hcl] Other (See Comments)     Chest pressure and tightness  . Tetracyclines & Related Other (See Comments)    Chest pressure and tightness    Physical Exam  Vitals  Blood pressure 136/97, pulse 69, temperature 98.2 F (36.8 C), temperature source Oral, resp. rate 15, weight 74.844 kg (165 lb), SpO2 100 %.   1. General  well-nourished male lying in bed in NAD,    2. Normal affect and insight, Not Suicidal or Homicidal, Awake Alert, Oriented X 3.  3. No F.N deficits, ALL C.Nerves Intact, Strength 5/5 all 4 extremities, Sensation intact all 4 extremities, Plantars down going.  4. Ears and Eyes appear Normal, Conjunctivae clear, PERRLA. Moist Oral Mucosa.  5. Supple Neck, No JVD, No cervical lymphadenopathy appriciated, No Carotid Bruits.  6. Symmetrical Chest wall movement, Good air movement bilaterally, CTAB.  7. RRR, No Gallops, Rubs or Murmurs, No Parasternal Heave.  8. Positive Bowel Sounds, Abdomen Soft, No tenderness, No organomegaly appriciated,No rebound -guarding or rigidity.  9.  No Cyanosis,left upper extremity erythema extending from the wrist, then medially over the left shoulder, tender, warm, no induration.  10. Good muscle tone,  joints appear normal , no effusions, Normal ROM.     Data Review  CBC  Recent Labs Lab 02/17/15 1433  WBC 11.5*  HGB 15.3  HCT 44.2  PLT 213  MCV 88.0  MCH 30.5  MCHC 34.6  RDW 12.4  LYMPHSABS 1.9  MONOABS 0.9  EOSABS 0.3  BASOSABS 0.1   ------------------------------------------------------------------------------------------------------------------  Chemistries   Recent Labs Lab 02/17/15 1433  NA 138  K 3.8  CL 103  CO2 26  GLUCOSE 101*  BUN 19  CREATININE 0.98  CALCIUM 9.6  AST 22  ALT 21  ALKPHOS 83  BILITOT 0.6   ------------------------------------------------------------------------------------------------------------------ estimated creatinine clearance is 82.4 mL/min (by C-G formula based on Cr of  0.98). ------------------------------------------------------------------------------------------------------------------ No results for input(s): TSH, T4TOTAL, T3FREE, THYROIDAB in the last 72 hours.  Invalid input(s): FREET3   Coagulation profile No results for input(s): INR, PROTIME in the last 168 hours. ------------------------------------------------------------------------------------------------------------------- No results for input(s): DDIMER in the last 72 hours. -------------------------------------------------------------------------------------------------------------------  Cardiac Enzymes No results for input(s): CKMB, TROPONINI, MYOGLOBIN in the last 168 hours.  Invalid input(s): CK ------------------------------------------------------------------------------------------------------------------ Invalid input(s): POCBNP   ---------------------------------------------------------------------------------------------------------------  Urinalysis    Component Value Date/Time   COLORURINE YELLOW 05/23/2012 2239   APPEARANCEUR CLOUDY* 05/23/2012 2239   LABSPEC 1.005 05/23/2012 2239   PHURINE 7.0 05/23/2012 2239   GLUCOSEU NEGATIVE 05/23/2012 2239   HGBUR LARGE* 05/23/2012 2239   BILIRUBINUR NEGATIVE 05/23/2012 2239   KETONESUR NEGATIVE 05/23/2012 2239   PROTEINUR NEGATIVE 05/23/2012 2239   UROBILINOGEN 0.2 05/23/2012 2239   NITRITE NEGATIVE 05/23/2012 2239   LEUKOCYTESUR NEGATIVE 05/23/2012 2239    ----------------------------------------------------------------------------------------------------------------  Imaging results:   No  results found.      Assessment & Plan  Active Problems:   Thrombophlebitis arm   Hyperlipemia    Left upper arm septic thrombophlebitis  -This is most likely related to IV access during recent surgery , patient will be started on IV vancomycin and Rocephin for coverage , received 1 dose of Lovenox treatment dose in ED  , discussed with both vascular surgery Dr. Scot Dock and hand surgery Dr. Lenon Curt cardiac anticoagulation , given this is a superficial vein there is no indication for anticoagulation , will need close follow-up with repeat venous Doppler within a week to ensure no progression of thrombophlebitis , will continue with warm compress to arm, hand elevation, and NSAIDs .  Hyperlipidemia  - Continue statin   Recent laparoscopic partial cecectomy 02/01/15 By Dr Zella Richer - Tubular adenoma of appendiceal orifice  DVT Prophylaxis  Lovenox  AM Labs Ordered, also please review Full Orders  Family Communication: Admission, patients condition and plan of care including tests being ordered have been discussed with the patient who indicate understanding and agree with the plan and Code Status.  Code Status   Likely DC to  home   Condition GUARDED    Time spent in minutes : 55 minutes    Tayjah Lobdell M.D on 02/17/2015 at 6:10 PM  Between 7am to 7pm - Pager - [phone]  After 7pm go to [email or web address] - password [email or web address]  And look for the night coverage person covering me after hours  [facility] Office  [phone]

## 2015-02-17 NOTE — Progress Notes (Signed)
  Subjective: Has done well from abdominal standpoint after surgery but developed superficial thrombophlebitis of LUE.  Is being admitted for IV abxs.  Objective: Vital signs in last 24 hours: Temp:  [98.2 F (36.8 C)] 98.2 F (36.8 C) (06/23 1317) Pulse Rate:  [69-90] 69 (06/23 1730) Resp:  [14-18] 15 (06/23 1730) BP: (131-138)/(87-97) 136/97 mmHg (06/23 1730) SpO2:  [95 %-100 %] 100 % (06/23 1730) Weight:  [74.844 kg (165 lb)] 74.844 kg (165 lb) (06/23 1756)    Intake/Output from previous day:   Intake/Output this shift:    PE: General- In NAD Abdomen-soft, incisions are clean and intact Left arm with redness and swelling from dorsum of hand to upper arm  Lab Results:   Recent Labs  02/17/15 1433  WBC 11.5*  HGB 15.3  HCT 44.2  PLT 213   BMET  Recent Labs  02/17/15 1433  NA 138  K 3.8  CL 103  CO2 26  GLUCOSE 101*  BUN 19  CREATININE 0.98  CALCIUM 9.6   PT/INR No results for input(s): LABPROT, INR in the last 72 hours. Comprehensive Metabolic Panel:    Component Value Date/Time   NA 138 02/17/2015 1433   NA 139 01/27/2015 1350   K 3.8 02/17/2015 1433   K 4.8 01/27/2015 1350   CL 103 02/17/2015 1433   CL 102 01/27/2015 1350   CO2 26 02/17/2015 1433   CO2 29 01/27/2015 1350   BUN 19 02/17/2015 1433   BUN 21* 01/27/2015 1350   CREATININE 0.98 02/17/2015 1433   CREATININE 1.01 01/27/2015 1350   GLUCOSE 101* 02/17/2015 1433   GLUCOSE 87 01/27/2015 1350   CALCIUM 9.6 02/17/2015 1433   CALCIUM 9.7 01/27/2015 1350   AST 22 02/17/2015 1433   AST 30 01/27/2015 1350   ALT 21 02/17/2015 1433   ALT 26 01/27/2015 1350   ALKPHOS 83 02/17/2015 1433   ALKPHOS 77 01/27/2015 1350   BILITOT 0.6 02/17/2015 1433   BILITOT 0.4 01/27/2015 1350   PROT 7.8 02/17/2015 1433   PROT 7.1 01/27/2015 1350   ALBUMIN 4.3 02/17/2015 1433   ALBUMIN 4.1 01/27/2015 1350     Studies/Results: No results found.  Anti-infectives: Anti-infectives    Start      Dose/Rate Route Frequency Ordered Stop   02/18/15 0800  vancomycin (VANCOCIN) IVPB 1000 mg/200 mL premix     1,000 mg 200 mL/hr over 60 Minutes Intravenous Every 12 hours 02/17/15 1815     02/17/15 1815  cefTRIAXone (ROCEPHIN) 1 g in dextrose 5 % 50 mL IVPB     1 g 100 mL/hr over 30 Minutes Intravenous Every 24 hours 02/17/15 1806     02/17/15 1800  vancomycin (VANCOCIN) IVPB 1000 mg/200 mL premix     1,000 mg 200 mL/hr over 60 Minutes Intravenous NOW 02/17/15 1745 02/18/15 1800      Assessment Active Problems:   Thrombophlebitis arm    Post partial cecectomy and appendectomy-path shows tubular adenoma involving staple line, no malignancy.    LOS: 0 days   Plan: Will give path report to him.  Okay to anticoagulate as needed.   Jimmy Edwards 02/17/2015

## 2015-02-17 NOTE — ED Notes (Signed)
Pt reports small bump on top of left hand 3 days ago, since then arm swelling and red streaks going up pts arm to shoulder. Pt went to pcp today, was told to come to ED for IV abx. Denies pain. Denies SOB. Denies n/v/d.

## 2015-02-18 LAB — CBC
HCT: 39.4 % (ref 39.0–52.0)
HEMOGLOBIN: 13.6 g/dL (ref 13.0–17.0)
MCH: 31.1 pg (ref 26.0–34.0)
MCHC: 34.5 g/dL (ref 30.0–36.0)
MCV: 90 fL (ref 78.0–100.0)
Platelets: 204 10*3/uL (ref 150–400)
RBC: 4.38 MIL/uL (ref 4.22–5.81)
RDW: 12.4 % (ref 11.5–15.5)
WBC: 9.3 10*3/uL (ref 4.0–10.5)

## 2015-02-18 LAB — BASIC METABOLIC PANEL
Anion gap: 9 (ref 5–15)
BUN: 19 mg/dL (ref 6–20)
CO2: 29 mmol/L (ref 22–32)
Calcium: 8.9 mg/dL (ref 8.9–10.3)
Chloride: 101 mmol/L (ref 101–111)
Creatinine, Ser: 1 mg/dL (ref 0.61–1.24)
GFR calc Af Amer: >60 mL/min (ref >60)
GLUCOSE: 98 mg/dL (ref 65–99)
POTASSIUM: 4.3 mmol/L (ref 3.5–5.1)
Sodium: 139 mmol/L (ref 135–145)

## 2015-02-18 MED ORDER — RISAQUAD PO CAPS
2.0000 | ORAL_CAPSULE | Freq: Every day | ORAL | Status: DC
  Filled 2015-02-18 (×2): qty 2

## 2015-02-18 NOTE — Progress Notes (Signed)
Patient Demographics  Jimmy Edwards, is a 54 y.o. male, DOB - 1960-10-01, RCB:[id number]  Admit date - 02/17/2015   Admitting Physician Albertine Patricia, MD  Outpatient Primary MD for the patient is Jerlyn Ly, MD  LOS - 1   Chief Complaint  Patient presents with  . 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  Subjective:   [name] today has, No headache, No chest pain, No abdominal pain - No Nausea, No new weakness tingling or numbness, No Cough - SOB.   Assessment & Plan    Active Problems:   Thrombophlebitis arm   Hyperlipemia  Left upper arm septic thrombophlebitis  - on IV vancomycin and Rocephin for coverage , discussed with  vascular surgery Dr. Scot Dock and hand surgery Dr. Lenon Curt cardiac anticoagulation no indication for anticoagulation given its superficial vein, will need close follow-up with repeat venous Doppler within a week to ensure no progression of thrombophlebitis , will continue with warm compress to arm, hand elevation, and NSAIDs . Appears to be improving continue with current management.  Hyperlipidemia  - Continue statin   Recent laparoscopic partial cecectomy 02/01/15 By Dr Zella Richer - Tubular adenoma of appendiceal orifice, surgical follow-up appreciated.  Code Status: Full  Family Communication: none at bedside.  Disposition Plan: home when stable, will need another day or 2 on IV antibiotics.   Procedures  none   Consults   gen surgery   Medications  Scheduled Meds: . acidophilus  2 capsule Oral Daily  . cefTRIAXone (ROCEPHIN)  IV  1 g Intravenous Q24H  . [START ON 02/19/2015] enoxaparin (LOVENOX) injection  40 mg  Subcutaneous Q24H  . escitalopram  20 mg Oral Daily  . ibuprofen  200 mg Oral TID  . multivitamin with minerals  1 tablet Oral Daily  . pantoprazole  40 mg Oral Daily  . simvastatin  20 mg Oral Daily  . vancomycin  1,000 mg Intravenous Q12H   Continuous Infusions: . sodium chloride 1,000 mL (02/18/15 0220)   PRN Meds:.acetaminophen **OR** acetaminophen, bisacodyl, diazepam, HYDROcodone-acetaminophen, ondansetron **OR** ondansetron (ZOFRAN) IV, polyethylene glycol  DVT Prophylaxis  Lovenox   Lab Results  Component Value Date   PLT 204 02/18/2015    Antibiotics    Anti-infectives    Start     Dose/Rate Route Frequency Ordered Stop   02/18/15 0800  vancomycin (VANCOCIN) IVPB 1000 mg/200 mL premix     1,000 mg 200 mL/hr over 60 Minutes Intravenous Every 12 hours 02/17/15 1815     02/17/15 1815  cefTRIAXone (ROCEPHIN) 1 g in dextrose 5 % 50 mL IVPB     1 g 100 mL/hr over 30 Minutes Intravenous Every 24 hours 02/17/15 1806     02/17/15 1800  vancomycin (VANCOCIN) IVPB 1000 mg/200 mL premix     1,000 mg 200 mL/hr over 60 Minutes Intravenous NOW 02/17/15 1745 02/17/15 2005          Objective:   Filed Vitals:   02/17/15 1840 02/17/15 1955 02/17/15 2054 02/18/15 0601  BP: 154/105  123/79 107/69  Pulse: 66 62 68 60  Temp: 99.4 F (37.4 C)  98.4 F (36.9 C) 98.3 F (36.8 C)  TempSrc: Oral  Oral Oral  Resp: 18  18 18   Height: 5' 7.5" (1.715 m)     Weight: 77.202 kg (170 lb 3.2 oz)     SpO2: 99%  98% 100%    Wt Readings from Last 3 Encounters:  02/17/15 77.202 kg (170 lb 3.2 oz)  02/01/15 74.844 kg (165 lb)  01/27/15 74.957 kg (165 lb 4 oz)     Intake/Output Summary (Last 24 hours) at 02/18/15 0946 Last data filed at 02/18/15 0700  Gross per 24 hour  Intake   1401 ml  Output      0 ml  Net   1401 ml     Physical Exam  Awake Alert, Oriented X 3, No new F.N deficits, Normal affect [place].AT,PERRAL Supple Neck,No JVD, No cervical lymphadenopathy appriciated.    Symmetrical Chest wall movement, Good air movement bilaterally, CTAB RRR,No Gallops,Rubs or new Murmurs, No Parasternal Heave +ve B.Sounds, Abd Soft, No tenderness, No organomegaly appriciated, No rebound - guarding or rigidity. No Cyanosis, Clubbing or edema, left arm thrombophlebitis, significantly improved, improved swelling and erythema.   Data Review   Micro Results No results found for this or any previous visit (from the past 240 hour(s)).  Radiology Reports No results found.   CBC  Recent Labs Lab 02/17/15 1433 02/18/15 0345  WBC 11.5* 9.3  HGB 15.3 13.6  HCT 44.2 39.4  PLT 213 204  MCV 88.0 90.0  MCH 30.5 31.1  MCHC 34.6 34.5  RDW 12.4 12.4  LYMPHSABS 1.9  --   MONOABS 0.9  --   EOSABS 0.3  --   BASOSABS 0.1  --     Chemistries   Recent Labs Lab 02/17/15 1433 02/18/15 0345  NA 138 139  K 3.8 4.3  CL 103 101  CO2 26 29  GLUCOSE 101* 98  BUN 19 19  CREATININE 0.98 1.00  CALCIUM 9.6 8.9  AST 22  --   ALT 21  --   ALKPHOS 83  --   BILITOT 0.6  --    ------------------------------------------------------------------------------------------------------------------ estimated creatinine clearance is 79.5 mL/min (by C-G formula based on Cr of 1). ------------------------------------------------------------------------------------------------------------------ No results for input(s): HGBA1C in the last 72 hours. ------------------------------------------------------------------------------------------------------------------ No results for input(s): CHOL, HDL, LDLCALC, TRIG, CHOLHDL, LDLDIRECT in the last 72 hours. ------------------------------------------------------------------------------------------------------------------ No results for input(s): TSH, T4TOTAL, T3FREE, THYROIDAB in the last 72 hours.  Invalid input(s): FREET3 ------------------------------------------------------------------------------------------------------------------ No  results for input(s): VITAMINB12, FOLATE, FERRITIN, TIBC, IRON, RETICCTPCT in the last 72 hours.  Coagulation profile No results for input(s): INR, PROTIME in the last 168 hours.  No results for input(s): DDIMER in the last 72 hours.  Cardiac Enzymes No results for input(s): CKMB, TROPONINI, MYOGLOBIN in the last 168 hours.  Invalid input(s): CK ------------------------------------------------------------------------------------------------------------------ Invalid input(s): POCBNP     Time Spent in minutes   30 minutes   Casper Pagliuca M.D on 02/18/2015 at 9:46 AM  Between 7am to 7pm - Pager - [phone]  After 7pm go to [email or web address] - password [facility]  [facility]   Office  [phone]

## 2015-02-18 NOTE — Progress Notes (Signed)
Patient refused need to use warm compress to left arm. States he was "using a heating pad at home and it was not helping". Obtained pillows for patient to elevate left arm.

## 2015-02-19 MED ORDER — IBUPROFEN 200 MG PO TABS: 200.0000 mg | ORAL_TABLET | Freq: Three times a day (TID) | ORAL | Status: AC

## 2015-02-19 MED ORDER — DOXYCYCLINE HYCLATE 100 MG PO TABS: 100.0000 mg | ORAL_TABLET | Freq: Two times a day (BID) | ORAL | Status: AC

## 2015-02-19 MED ORDER — RISAQUAD PO CAPS: 2.0000 | ORAL_CAPSULE | Freq: Every day | ORAL | Status: DC

## 2015-02-19 MED ORDER — CEFPODOXIME PROXETIL 200 MG PO TABS: 200.0000 mg | ORAL_TABLET | Freq: Two times a day (BID) | ORAL | Status: AC

## 2015-02-19 NOTE — Discharge Instructions (Signed)
Follow with Primary MD Crist Infante A, MD in 3-4 days  - Please have left upper extremity venous Doppler repeated during next visit - Lee's keep left arm elevated with 2 pillows, apply warm compress Get CBC, CMP, 2 view Chest X ray checked  by Primary MD next visit.    Activity: As tolerated with Full fall precautions use walker/cane & assistance as needed   Disposition Home   Diet: Heart Healthy ** , with feeding assistance and aspiration precautions.  For Heart failure patients - Check your Weight same time everyday, if you gain over 2 pounds, or you develop in leg swelling, experience more shortness of breath or chest pain, call your Primary MD immediately. Follow Cardiac Low Salt Diet and 1.5 lit/day fluid restriction.   On your next visit with your primary care physician please Get Medicines reviewed and adjusted.   Please request your Prim.MD to go over all Hospital Tests and Procedure/Radiological results at the follow up, please get all Hospital records sent to your Prim MD by signing hospital release before you go home.   If you experience worsening of your admission symptoms, develop shortness of breath, life threatening emergency, suicidal or homicidal thoughts you must seek medical attention immediately by calling 911 or calling your MD immediately  if symptoms less severe.  You Must read complete instructions/literature along with all the possible adverse reactions/side effects for all the Medicines you take and that have been prescribed to you. Take any new Medicines after you have completely understood and accpet all the possible adverse reactions/side effects.   Do not drive, operating heavy machinery, perform activities at heights, swimming or participation in water activities or provide baby sitting services if your were admitted for syncope or siezures until you have seen by Primary MD or a Neurologist and advised to do so again.  Do not drive when taking Pain  medications.    Do not take more than prescribed Pain, Sleep and Anxiety Medications  Special Instructions: If you have smoked or chewed Tobacco  in the last 2 yrs please stop smoking, stop any regular Alcohol  and or any Recreational drug use.  Wear Seat belts while driving.   Please note  You were cared for by a hospitalist during your hospital stay. If you have any questions about your discharge medications or the care you received while you were in the hospital after you are discharged, you can call the unit and asked to speak with the hospitalist on call if the hospitalist that took care of you is not available. Once you are discharged, your primary care physician will handle any further medical issues. Please note that NO REFILLS for any discharge medications will be authorized once you are discharged, as it is imperative that you return to your primary care physician (or establish a relationship with a primary care physician if you do not have one) for your aftercare needs so that they can reassess your need for medications and monitor your lab values.

## 2015-02-19 NOTE — Discharge Summary (Signed)
Jimmy Edwards, is a 54 y.o. male  DOB 05/30/1961  MRN [id number].  Admission date:  02/17/2015  Admitting Physician  Albertine Patricia, MD  Discharge Date:  02/19/2015   Primary MD  Jerlyn Ly, MD  Recommendations for primary care physician for things to follow:  - Please check CBC, BMP during next visit, patient will need repeat left upper extremity venous Doppler to ensure there is no progression of his thrombophlebitis during next visit, and instructed to make appointment in 3-4 days with PCP.   Admission Diagnosis  Arm Infection   Discharge Diagnosis  Arm Infection    Active Problems:   Thrombophlebitis arm   Hyperlipemia      Past Medical History  Diagnosis Date  . Depression     takes lexapro  . Left ureteral calculus   . Hyperlipidemia   . GERD (gastroesophageal reflux disease)   . Anxiety   . History of hiatal hernia     Past Surgical History  Procedure Laterality Date  . Extracorporeal shock wave lithotripsy Left 02-09-2013  . Colonscopy     . Upper gi endoscopy    . Root canal    . Colon resection N/A 02/01/2015    Procedure: LAPAROSCOPIC partial cecectomy with appendectomy;  Surgeon: Jackolyn Confer, MD;  Location: [facility]S;  Service: General;  Laterality: N/A;       History of present illness and  Hospital Course:     Kindly see H&P for history of present illness and admission details, please review complete Labs, Consult reports and Test reports for all details in brief  HPI  from the history and physical done on the day of admission  Jimmy Edwards is a 54 y.o. 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 02/01/15 By Dr Zella Richer, patient presents from his PCP office for positive left upper venous Doppler for thrombophlebitis, patient developed thrombophlebitis secondary to IV axis post  surgery, given IM Rocephin and by mouth doxycycline by his PCP yesterday, without much improvement, patient had left upper extremity venous Doppler which confirmed cephalic thrombophlebitis from anti-cubital fossa to mid forearm, patient was sent by PCP to ED for further management, patient received one dose of therapeutic Lovenox in ED, did on IV vancomycin and Rocephin, she denies any fever chills, chest pain shortness of breath.  Hospital Course   Left upper arm septic thrombophlebitis  - Treated with IV vancomycin and Rocephin, discussed with vascular surgery Dr. Scot Dock and hand surgery Dr. Lenon Curt cardiac anticoagulation no indication for anticoagulation given its superficial vein, will need close follow-up with repeat venous Doppler within a week to ensure no progression of thrombophlebitis , and instructed to follow with his PCP this coming Tuesday or Wednesday, and have repeat venous ultrasound done then,, instructed to continue with warm compress to arm, hand elevation, and NSAIDs . Thrombophlebitis clinically significantly improved. Was asked to stay on other days on IV antibiotics, but he wanted to be discharged today . - We'll discharge  on 10 days of oral doxycycline and Vantin.  Hyperlipidemia  - Continue statin   Recent laparoscopic partial cecectomy 02/01/15 By Dr Zella Richer - Tubular adenoma of appendiceal orifice, surgical follow-up appreciated.     Discharge Condition: Stable   Follow UP  Follow-up Information    Follow up with PERINI,MARK A, MD. Schedule an appointment as soon as possible for a visit in 4 days.   Specialty:  Internal Medicine   Why:  Posthospitalization follow-up, repeat left upper extremity venous Doppler   Contact information:   [address] [place] [address] [phone]         Discharge Instructions  and  Discharge Medications     Discharge Instructions    Diet - low sodium heart healthy    Complete by:  As directed       Discharge instructions    Complete by:  As directed   Follow with Primary MD Crist Infante A, MD in 3-4 days  - Please have left upper extremity venous Doppler repeated during next visit - Please keep left arm elevated with 2 pillows, and apply warm compress.  Get CBC, CMP, 2 view Chest X ray checked  by Primary MD next visit.    Activity: As tolerated with Full fall precautions use walker/cane & assistance as needed   Disposition Home   Diet: Heart Healthy ** , with feeding assistance and aspiration precautions.  For Heart failure patients - Check your Weight same time everyday, if you gain over 2 pounds, or you develop in leg swelling, experience more shortness of breath or chest pain, call your Primary MD immediately. Follow Cardiac Low Salt Diet and 1.5 lit/day fluid restriction.   On your next visit with your primary care physician please Get Medicines reviewed and adjusted.   Please request your Prim.MD to go over all Hospital Tests and Procedure/Radiological results at the follow up, please get all Hospital records sent to your Prim MD by signing hospital release before you go home.   If you experience worsening of your admission symptoms, develop shortness of breath, life threatening emergency, suicidal or homicidal thoughts you must seek medical attention immediately by calling 911 or calling your MD immediately  if symptoms less severe.  You Must read complete instructions/literature along with all the possible adverse reactions/side effects for all the Medicines you take and that have been prescribed to you. Take any new Medicines after you have completely understood and accpet all the possible adverse reactions/side effects.   Do not drive, operating heavy machinery, perform activities at heights, swimming or participation in water activities or provide baby sitting services if your were admitted for syncope or siezures until you have seen by Primary MD or a Neurologist and  advised to do so again.  Do not drive when taking Pain medications.    Do not take more than prescribed Pain, Sleep and Anxiety Medications  Special Instructions: If you have smoked or chewed Tobacco  in the last 2 yrs please stop smoking, stop any regular Alcohol  and or any Recreational drug use.  Wear Seat belts while driving.   Please note  You were cared for by a hospitalist during your hospital stay. If you have any questions about your discharge medications or the care you received while you were in the hospital after you are discharged, you can call the unit and asked to speak with the hospitalist on call if the hospitalist that took care of you is not available. Once you are  discharged, your primary care physician will handle any further medical issues. Please note that NO REFILLS for any discharge medications will be authorized once you are discharged, as it is imperative that you return to your primary care physician (or establish a relationship with a primary care physician if you do not have one) for your aftercare needs so that they can reassess your need for medications and monitor your lab values.     Increase activity slowly    Complete by:  As directed             Medication List    STOP taking these medications        acetaminophen-codeine 300-15 MG per tablet  Commonly known as:  TYLENOL #2      TAKE these medications        acetaminophen 500 MG tablet  Commonly known as:  TYLENOL  Take 1,000 mg by mouth every 6 (six) hours as needed for moderate pain (pain).     acidophilus Caps capsule  Take 2 capsules by mouth daily.     cefpodoxime 200 MG tablet  Commonly known as:  VANTIN  Take 1 tablet (200 mg total) by mouth 2 (two) times daily.     diazepam 5 MG tablet  Commonly known as:  VALIUM  Take 5 mg by mouth daily as needed for anxiety (anxiety). For anxiety     doxycycline 100 MG tablet  Commonly known as:  VIBRA-TABS  Take 1 tablet (100 mg total)  by mouth 2 (two) times daily.     escitalopram 20 MG tablet  Commonly known as:  LEXAPRO  Take 20 mg by mouth daily.     ibuprofen 200 MG tablet  Commonly known as:  ADVIL,MOTRIN  Take 1 tablet (200 mg total) by mouth 3 (three) times daily. Take for next 5 days then stop.     multivitamin with minerals Tabs tablet  Take 1 tablet by mouth daily.     omeprazole 20 MG capsule  Commonly known as:  PRILOSEC  Take 20 mg by mouth daily.     simvastatin 20 MG tablet  Commonly known as:  ZOCOR  Take 20 mg by mouth daily.          Diet and Activity recommendation: See Discharge Instructions above   Consults obtained - seen by general surgery  Major procedures and Radiology Reports - PLEASE review detailed and final reports for all details, in brief -      No results found.  Micro Results     No results found for this or any previous visit (from the past 240 hour(s)).     Today   Subjective:   Jimmy Edwards today has no headache,no chest abdominal pain,no new weakness tingling or numbness, feels much better wants to go home today.   Objective:   Blood pressure 123/70, pulse 60, temperature 97.6 F (36.4 C), temperature source Oral, resp. rate 16, height 5' 7.5" (1.715 m), weight 77.202 kg (170 lb 3.2 oz), SpO2 100 %.   Intake/Output Summary (Last 24 hours) at 02/19/15 0930 Last data filed at 02/19/15 2202  Gross per 24 hour  Intake   1930 ml  Output      0 ml  Net   1930 ml    Exam Awake Alert, Oriented x 3, No new F.N deficits, Normal affect [place].AT,PERRAL Supple Neck,No JVD, No cervical lymphadenopathy appriciated.  Symmetrical Chest wall movement, Good air movement bilaterally, CTAB RRR,No Gallops,Rubs or new Murmurs,  No Parasternal Heave +ve B.Sounds, Abd Soft, Non tender, No organomegaly appriciated, No rebound -guarding or rigidity. No Cyanosis, left upper extremity thrombophlebitis significantly improved, swelling and erythema significantly  subsided.  Data Review   CBC w Diff: Lab Results  Component Value Date   WBC 9.3 02/18/2015   HGB 13.6 02/18/2015   HCT 39.4 02/18/2015   PLT 204 02/18/2015   LYMPHOPCT 17 02/17/2015   MONOPCT 8 02/17/2015   EOSPCT 2 02/17/2015   BASOPCT 0 02/17/2015    CMP: Lab Results  Component Value Date   NA 139 02/18/2015   K 4.3 02/18/2015   CL 101 02/18/2015   CO2 29 02/18/2015   BUN 19 02/18/2015   CREATININE 1.00 02/18/2015   PROT 7.8 02/17/2015   ALBUMIN 4.3 02/17/2015   BILITOT 0.6 02/17/2015   ALKPHOS 83 02/17/2015   AST 22 02/17/2015   ALT 21 02/17/2015  .   Total Time in preparing paper work, data evaluation and todays exam - 35 minutes  Jimmy Edwards M.D on 02/19/2015 at 9:30 AM  [facility]   Office  [phone]

## 2015-02-19 NOTE — Progress Notes (Signed)
Patient discharged to home on his own, walked down to car. Discharge instructions reviewed with patient who verbalized understanding. New RX's given to patient.

## 2015-02-19 NOTE — Progress Notes (Signed)
LUE is better.  Should restrict lifting with the arm to 10 pounds maximum until healed.  Will need repeat colonoscopy with Dr. Hilarie Fredrickson in 3-6 months.  Follow up with me prn.

## 2015-02-22 NOTE — ED Provider Notes (Signed)
CSN: [id number]     Arrival date & time 02/17/15  1304 History   First MD Initiated Contact with Patient 02/17/15 1627     Chief Complaint  Patient presents with  . arm infection       HPI  She presents evaluation of a painful red and swollen left hand. Had a arthroscopic abdominal surgery several weeks ago. Convalesced well. His had a "red spot" left hand. Become more red and swollen now his arm and forearm are stone has tender palpable firm area of his arm and now he has red streaks to shoulder. No fevers no chills. No Reiter's.  Past Medical History  Diagnosis Date  . Depression     takes lexapro  . Left ureteral calculus   . Hyperlipidemia   . GERD (gastroesophageal reflux disease)   . Anxiety   . History of hiatal hernia    Past Surgical History  Procedure Laterality Date  . Extracorporeal shock wave lithotripsy Left 02-09-2013  . Colonscopy     . Upper gi endoscopy    . Root canal    . Colon resection N/A 02/01/2015    Procedure: LAPAROSCOPIC partial cecectomy with appendectomy;  Surgeon: Jackolyn Confer, MD;  Location: [facility];  Service: General;  Laterality: N/A;   Family History  Problem Relation Age of Onset  . Lung cancer Maternal Grandmother   . Colon cancer Neg Hx   . Colon polyps Mother   . Heart disease Father   . Heart disease Maternal Grandmother   . Esophageal cancer Neg Hx   . Kidney disease Neg Hx   . Diabetes Neg Hx   . Gallbladder disease Neg Hx    History  Substance Use Topics  . Smoking status: Never Smoker   . Smokeless tobacco: Never Used  . Alcohol Use: 0.0 oz/week    0 Standard drinks or equivalent per week     Comment: Rarely    Review of Systems  Constitutional: Negative for fever, chills, diaphoresis, appetite change and fatigue.  HENT: Negative for mouth sores, sore throat and trouble swallowing.   Eyes: Negative for visual disturbance.  Respiratory: Negative for cough, chest tightness, shortness of breath and wheezing.     Cardiovascular: Negative for chest pain.  Gastrointestinal: Negative for nausea, vomiting, abdominal pain, diarrhea and abdominal distention.  Endocrine: Negative for polydipsia, polyphagia and polyuria.  Genitourinary: Negative for dysuria, frequency and hematuria.  Musculoskeletal: Negative for gait problem.  Skin: Negative for color change, pallor and rash.       Redness and swelling in the left hand and arm.  Neurological: Negative for dizziness, syncope, light-headedness and headaches.  Hematological: Does not bruise/bleed easily.  Psychiatric/Behavioral: Negative for behavioral problems and confusion.      Allergies  Cleocin and Tetracyclines & related  Home Medications   Prior to Admission medications   Medication Sig Start Date End Date Taking? Authorizing Provider  acetaminophen (TYLENOL) 500 MG tablet Take 1,000 mg by mouth every 6 (six) hours as needed for moderate pain (pain).   Yes Historical Provider, MD  diazepam (VALIUM) 5 MG tablet Take 5 mg by mouth daily as needed for anxiety (anxiety). For anxiety   Yes Historical Provider, MD  escitalopram (LEXAPRO) 20 MG tablet Take 20 mg by mouth daily.    Yes Historical Provider, MD  Multiple Vitamin (MULTIVITAMIN WITH MINERALS) TABS Take 1 tablet by mouth daily.   Yes Historical Provider, MD  omeprazole (PRILOSEC) 20 MG capsule Take 20 mg by mouth  daily.   Yes Historical Provider, MD  simvastatin (ZOCOR) 20 MG tablet Take 20 mg by mouth daily.   Yes Historical Provider, MD  acidophilus (RISAQUAD) CAPS capsule Take 2 capsules by mouth daily. 02/19/15   Silver Huguenin Elgergawy, MD  cefpodoxime (VANTIN) 200 MG tablet Take 1 tablet (200 mg total) by mouth 2 (two) times daily. 02/19/15 03/01/15  Silver Huguenin Elgergawy, MD  doxycycline (VIBRA-TABS) 100 MG tablet Take 1 tablet (100 mg total) by mouth 2 (two) times daily. 02/19/15 03/01/15  Silver Huguenin Elgergawy, MD  ibuprofen (ADVIL,MOTRIN) 200 MG tablet Take 1 tablet (200 mg total) by mouth 3 (three)  times daily. Take for next 5 days then stop. 02/19/15 02/23/15  Silver Huguenin Elgergawy, MD   BP 123/70 mmHg  Pulse 60  Temp(Src) 97.6 F (36.4 C) (Oral)  Resp 16  Ht 5' 7.5" (1.715 m)  Wt 170 lb 3.2 oz (77.202 kg)  BMI 26.25 kg/m2  SpO2 100% Physical Exam  Constitutional: He is oriented to person, place, and time. He appears well-developed and well-nourished. No distress.  HENT:  Head: Normocephalic.  Eyes: Conjunctivae are normal. Pupils are equal, round, and reactive to light. No scleral icterus.  Neck: Normal range of motion. Neck supple. No thyromegaly present.  Cardiovascular: Normal rate and regular rhythm.  Exam reveals no gallop and no friction rub.   No murmur heard. Pulmonary/Chest: Effort normal and breath sounds normal. No respiratory distress. He has no wheezes. He has no rales.  Abdominal: Soft. Bowel sounds are normal. He exhibits no distension. There is no tenderness. There is no rebound.  Musculoskeletal: Normal range of motion.  Neurological: He is alert and oriented to person, place, and time.  Skin: Skin is warm and dry. No rash noted.     Psychiatric: He has a normal mood and affect. His behavior is normal.    ED Course  Procedures (including critical care time) Labs Review Labs Reviewed  COMPREHENSIVE METABOLIC PANEL - Abnormal; Notable for the following:    Glucose, Bld 101 (*)    All other components within normal limits  CBC WITH DIFFERENTIAL/PLATELET - Abnormal; Notable for the following:    WBC 11.5 (*)    Neutro Abs 8.3 (*)    All other components within normal limits  BASIC METABOLIC PANEL  CBC    Imaging Review No results found.   EKG Interpretation None      MDM   Final diagnoses:  None    Discussed with hospitalist. IV vancomycin initiated. Discussed with general surgery. They're okay with anticoagulation.    Tanna Furry, MD 02/22/15 [phone]

## 2015-04-24 ENCOUNTER — Encounter: Payer: Self-pay | Admitting: Internal Medicine

## 2015-11-17 ENCOUNTER — Ambulatory Visit (AMBULATORY_SURGERY_CENTER): Payer: Self-pay | Admitting: *Deleted

## 2015-11-17 VITALS — Ht 68.0 in | Wt 167.8 lb

## 2015-11-17 DIAGNOSIS — Z86010 Personal history of colonic polyps: Secondary | ICD-10-CM

## 2015-11-17 MED ORDER — NA SULFATE-K SULFATE-MG SULF 17.5-3.13-1.6 GM/177ML PO SOLN: ORAL | Status: DC

## 2015-11-17 NOTE — Progress Notes (Signed)
No allergies to eggs or soy. No problems with anesthesia.  Pt given Emmi instructions for colonoscopy  No oxygen use  No diet drug use  

## 2015-11-18 ENCOUNTER — Telehealth: Payer: Self-pay | Admitting: Internal Medicine

## 2015-11-18 NOTE — Telephone Encounter (Signed)
Miralax instructions given via telephone.  Pt voiced understanding.  Aavya Shafer/PV

## 2015-11-22 ENCOUNTER — Telehealth: Payer: Self-pay

## 2015-11-22 NOTE — Telephone Encounter (Signed)
Lm asking if patient still needs help with obtaining suprep

## 2015-11-23 ENCOUNTER — Telehealth: Payer: Self-pay

## 2015-11-23 NOTE — Telephone Encounter (Signed)
Left message on vm for patient to call back and let us know if needed a sample or not

## 2015-11-25 ENCOUNTER — Ambulatory Visit (AMBULATORY_SURGERY_CENTER): Payer: BLUE CROSS/BLUE SHIELD | Admitting: Internal Medicine

## 2015-11-25 ENCOUNTER — Encounter: Payer: Self-pay | Admitting: Internal Medicine

## 2015-11-25 VITALS — BP 111/75 | HR 64 | Temp 98.7°F | Resp 12 | Ht 67.5 in | Wt 168.0 lb

## 2015-11-25 DIAGNOSIS — D120 Benign neoplasm of cecum: Secondary | ICD-10-CM

## 2015-11-25 DIAGNOSIS — Z86010 Personal history of colonic polyps: Secondary | ICD-10-CM

## 2015-11-25 MED ORDER — SODIUM CHLORIDE 0.9 % IV SOLN: 500.0000 mL | INTRAVENOUS | Status: DC

## 2015-11-25 NOTE — Progress Notes (Signed)
Called to room to assist during endoscopic procedure.  Patient ID and intended procedure confirmed with present staff. Received instructions for my participation in the procedure from the performing physician.  

## 2015-11-25 NOTE — Patient Instructions (Addendum)
YOU HAD AN ENDOSCOPIC PROCEDURE TODAY AT [facility]:   Refer to the procedure report that was given to you for any specific questions about what was found during the examination.  If the procedure report does not answer your questions, please call your gastroenterologist to clarify.  If you requested that your care partner not be given the details of your procedure findings, then the procedure report has been included in a sealed envelope for you to review at your convenience later.  YOU SHOULD EXPECT: Some feelings of bloating in the abdomen. Passage of more gas than usual.  Walking can help get rid of the air that was put into your GI tract during the procedure and reduce the bloating. If you had a lower endoscopy (such as a colonoscopy or flexible sigmoidoscopy) you may notice spotting of blood in your stool or on the toilet paper. If you underwent a bowel prep for your procedure, you may not have a normal bowel movement for a few days.  Please Note:  You might notice some irritation and congestion in your nose or some drainage.  This is from the oxygen used during your procedure.  There is no need for concern and it should clear up in a day or so.  SYMPTOMS TO REPORT IMMEDIATELY:   Following lower endoscopy (colonoscopy or flexible sigmoidoscopy):  Excessive amounts of blood in the stool  Significant tenderness or worsening of abdominal pains  Swelling of the abdomen that is new, acute  Fever of 100F or higher    For urgent or emergent issues, a gastroenterologist can be reached at any hour by calling [phone].   DIET: Your first meal following the procedure should be a small meal and then it is ok to progress to your normal diet. Heavy or fried foods are harder to digest and may make you feel nauseous or bloated.  Likewise, meals heavy in dairy and vegetables can increase bloating.  Drink plenty of fluids but you should avoid alcoholic beverages for 24  hours.  ACTIVITY:  You should plan to take it easy for the rest of today and you should NOT DRIVE or use heavy machinery until tomorrow (because of the sedation medicines used during the test).    FOLLOW UP: Our staff will call the number listed on your records the next business day following your procedure to check on you and address any questions or concerns that you may have regarding the information given to you following your procedure. If we do not reach you, we will leave a message.  However, if you are feeling well and you are not experiencing any problems, there is no need to return our call.  We will assume that you have returned to your regular daily activities without incident.  If any biopsies were taken you will be contacted by phone or by letter within the next 1-3 weeks.  Please call us at [phone] if you have not heard about the biopsies in 3 weeks.    SIGNATURES/CONFIDENTIALITY: You and/or your care partner have signed paperwork which will be entered into your electronic medical record.  These signatures attest to the fact that that the information above on your After Visit Summary has been reviewed and is understood.  Full responsibility of the confidentiality of this discharge information lies with you and/or your care-partner.   Await pathology results  Repeat colonoscopy in 6 mos as recommended by Dr Hilarie Fredrickson    Hold aspirin and anti inflammatory  products for 2 weeks

## 2015-11-25 NOTE — Op Note (Signed)
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 The patient's tolerance of                            previous anesthesia was also reviewed. The risks                            and benefits of the procedure and the sedation                            options and risks were discussed with the patient.                            All questions were answered, and informed consent                            was obtained. Prior Anticoagulants: The patient has                            taken no previous anticoagulant or antiplatelet                            agents. ASA Grade Assessment: II - A patient with  mild systemic disease. After reviewing the risks                            and benefits, the patient was deemed in                            satisfactory condition to undergo the procedure.                           After obtaining informed consent, the colonoscope                            was passed under direct vision. Throughout the                            procedure, the patient's blood pressure, pulse, and   oxygen saturations were monitored continuously. The                            Model CF-HQ190L [id number]) scope was introduced                            through the anus and advanced to the the cecum,                            identified by appendiceal orifice and ileocecal                            valve. The colonoscopy was performed without                            difficulty. The patient tolerated the procedure                            well. The quality of the bowel preparation was                            good. The ileocecal valve, appendiceal orifice, and                            rectum were photographed. The bowel preparation                            used was SUPREP. Scope In: 11:55:43 AM Scope Out: 12:17:17 PM Scope Withdrawal Time: 0 hours 18 minutes 30 seconds  Total Procedure Duration: 0 hours 21 minutes 34 seconds  Findings:      The perianal exam findings include thrombosed external hemorrhoids.      A 25 mm polyp was found in the cecal base in the area of the expected       appendix (though previously removed). The polyp was sessile and       multi-lobular. The polyp was removed with a piecemeal technique using a       hot snare. Resection and retrieval were felt to complete.      External and internal  hemorrhoids were found during retroflexion and       during perianal exam. The hemorrhoids were medium-sized.      The exam was otherwise without abnormality. Complications:            No immediate complications. Estimated Blood Loss:     Estimated blood loss: none. Impression:               - Thrombosed external hemorrhoids found on perianal                            exam.                           - One 25 mm polyp in the cecal base                            (recurrent/residual) polyp, removed piecemeal using                            a hot snare. Resected and retrieved.                           - External and internal hemorrhoids.                            - The examination was otherwise normal. Recommendation:           - Patient has a contact number available for                            emergencies. The signs and symptoms of potential                            delayed complications were discussed with the                            patient. Return to normal activities tomorrow.                            Written discharge instructions were provided to the                            patient.                           - Resume previous diet.                           - Continue present medications.                           - No aspirin, ibuprofen, naproxen, or other                            non-steroidal anti-inflammatory drugs for 2 weeks  after polyp removal.                           - Repeat colonoscopy in 6 months for surveillance                            after piecemeal polypectomy of residual/recurrent                            cecal polyp. If polyp recurs at next endoscopic                            surveillance, then ileocecectomy will be                            recommended. Procedure Code(s):        --- Professional ---                           [phone], Colonoscopy, flexible; with removal of                            tumor(s), polyp(s), or other lesion(s) by snare                            technique CPT copyright 2016 [employer]. All rights reserved. Jerene Bears, MD 11/25/2015 12:27:43 PM This report has been signed electronically. Number of Addenda: 0 Referring MD:      Ophelia Charter, MD, Odis Hollingshead, MD

## 2015-11-28 ENCOUNTER — Telehealth: Payer: Self-pay | Admitting: *Deleted

## 2015-11-28 NOTE — Telephone Encounter (Signed)
No answer. Name identifier. Message left to call if any question

## 2015-11-29 ENCOUNTER — Encounter: Payer: Self-pay | Admitting: Internal Medicine

## 2016-03-30 ENCOUNTER — Encounter: Payer: Self-pay | Admitting: Internal Medicine

## 2016-03-30 DIAGNOSIS — E784 Other hyperlipidemia: Secondary | ICD-10-CM | POA: Diagnosis not present

## 2016-04-09 DIAGNOSIS — H524 Presbyopia: Secondary | ICD-10-CM | POA: Diagnosis not present

## 2016-04-09 DIAGNOSIS — H1859 Other hereditary corneal dystrophies: Secondary | ICD-10-CM | POA: Diagnosis not present

## 2016-04-09 DIAGNOSIS — H5213 Myopia, bilateral: Secondary | ICD-10-CM | POA: Diagnosis not present

## 2016-06-06 ENCOUNTER — Encounter: Payer: Self-pay | Admitting: Internal Medicine

## 2016-07-26 ENCOUNTER — Ambulatory Visit (AMBULATORY_SURGERY_CENTER): Payer: Self-pay

## 2016-07-26 ENCOUNTER — Encounter: Payer: Self-pay | Admitting: Internal Medicine

## 2016-07-26 VITALS — Ht 68.0 in | Wt 162.2 lb

## 2016-07-26 DIAGNOSIS — Z860100 Personal history of colon polyps, unspecified: Secondary | ICD-10-CM

## 2016-07-26 NOTE — Progress Notes (Signed)
No allergies to eggs or soy No past problems with anesthesia No home oxygen No diet meds  Declined emmi 

## 2016-08-02 ENCOUNTER — Encounter: Payer: Self-pay | Admitting: Internal Medicine

## 2016-08-02 ENCOUNTER — Ambulatory Visit (AMBULATORY_SURGERY_CENTER): Payer: BLUE CROSS/BLUE SHIELD | Admitting: Internal Medicine

## 2016-08-02 VITALS — BP 118/72 | HR 71 | Temp 98.2°F | Resp 17 | Ht 68.0 in | Wt 162.0 lb

## 2016-08-02 DIAGNOSIS — Z86010 Personal history of colonic polyps: Secondary | ICD-10-CM | POA: Diagnosis not present

## 2016-08-02 DIAGNOSIS — Z1211 Encounter for screening for malignant neoplasm of colon: Secondary | ICD-10-CM | POA: Diagnosis not present

## 2016-08-02 MED ORDER — SODIUM CHLORIDE 0.9 % IV SOLN: 500.0000 mL | INTRAVENOUS | Status: DC

## 2016-08-02 NOTE — Patient Instructions (Signed)
Hemorrhoids and anal fissure seen. Handout given on hemorrhoids. Repeat colonoscopy in 2 years. Resume current medications. Call us with any questions or concerns. Thank you!  YOU HAD AN ENDOSCOPIC PROCEDURE TODAY AT [facility]:   Refer to the procedure report that was given to you for any specific questions about what was found during the examination.  If the procedure report does not answer your questions, please call your gastroenterologist to clarify.  If you requested that your care partner not be given the details of your procedure findings, then the procedure report has been included in a sealed envelope for you to review at your convenience later.  YOU SHOULD EXPECT: Some feelings of bloating in the abdomen. Passage of more gas than usual.  Walking can help get rid of the air that was put into your GI tract during the procedure and reduce the bloating. If you had a lower endoscopy (such as a colonoscopy or flexible sigmoidoscopy) you may notice spotting of blood in your stool or on the toilet paper. If you underwent a bowel prep for your procedure, you may not have a normal bowel movement for a few days.  Please Note:  You might notice some irritation and congestion in your nose or some drainage.  This is from the oxygen used during your procedure.  There is no need for concern and it should clear up in a day or so.  SYMPTOMS TO REPORT IMMEDIATELY:   Following lower endoscopy (colonoscopy or flexible sigmoidoscopy):  Excessive amounts of blood in the stool  Significant tenderness or worsening of abdominal pains  Swelling of the abdomen that is new, acute  Fever of 100F or higher   For urgent or emergent issues, a gastroenterologist can be reached at any hour by calling [phone].   DIET:  We do recommend a small meal at first, but then you may proceed to your regular diet.  Drink plenty of fluids but you should avoid alcoholic beverages for 24  hours.  ACTIVITY:  You should plan to take it easy for the rest of today and you should NOT DRIVE or use heavy machinery until tomorrow (because of the sedation medicines used during the test).    FOLLOW UP: Our staff will call the number listed on your records the next business day following your procedure to check on you and address any questions or concerns that you may have regarding the information given to you following your procedure. If we do not reach you, we will leave a message.  However, if you are feeling well and you are not experiencing any problems, there is no need to return our call.  We will assume that you have returned to your regular daily activities without incident.  If any biopsies were taken you will be contacted by phone or by letter within the next 1-3 weeks.  Please call us at [phone] if you have not heard about the biopsies in 3 weeks.    SIGNATURES/CONFIDENTIALITY: You and/or your care partner have signed paperwork which will be entered into your electronic medical record.  These signatures attest to the fact that that the information above on your After Visit Summary has been reviewed and is understood.  Full responsibility of the confidentiality of this discharge information lies with you and/or your care-partner.

## 2016-08-02 NOTE — Accreditation Note (Signed)
A/ox3 pleased with MAC, report to Robbin RN 

## 2016-08-02 NOTE — Op Note (Signed)
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 The patient's tolerance of                            previous anesthesia was also reviewed. The risks                            and benefits of the procedure and the sedation                            options and risks were discussed with the patient.                            All questions were answered, and informed consent                            was obtained. Prior Anticoagulants: The patient has                            taken no previous anticoagulant or antiplatelet                            agents. ASA Grade Assessment: II - A patient with  mild systemic disease. After reviewing the risks                            and benefits, the patient was deemed in                            satisfactory condition to undergo the procedure.                           After obtaining informed consent, the colonoscope                            was passed under direct vision. Throughout the                            procedure, the patient's blood pressure,  pulse, and                            oxygen saturations were monitored continuously. The                            Model CF-HQ190L [id number]) scope was introduced                            through the anus and advanced to the the terminal                            ileum. The colonoscopy was performed without                            difficulty. The patient tolerated the procedure                            well. The quality of the bowel preparation was                            good. The terminal ileum, ileocecal valve,                            appendiceal orifice, and rectum were photographed. Scope In: 8:13:46 AM Scope Out: 8:30:57 AM Scope Withdrawal Time: 0 hours 12 minutes 27 seconds  Total Procedure Duration: 0 hours 17 minutes 11 seconds  Findings:                 The perianal exam findings include hemorrhoids.                           The terminal ileum appeared normal.                           There is no endoscopic evidence of residual                            polyp/adenoma in the cecum.  The colon (entire examined portion) appeared normal.                           External and internal hemorrhoids were found during                            retroflexion and during digital exam. The                            hemorrhoids were medium-sized. Posterior anal                            fissure seen on direct view. Complications:            No immediate complications. Estimated Blood Loss:     Estimated blood loss: none. Impression:               - The examined portion of the ileum was normal.                           - The entire examined colon is normal. No evidence                            of residual polyp in the cecum.                           - External and internal hemorrhoids. Posterior anal                            fissure.                           - No specimens collected. Recommendation:           - Patient has a contact  number available for                            emergencies. The signs and symptoms of potential                            delayed complications were discussed with the                            patient. Return to normal activities tomorrow.                            Written discharge instructions were provided to the                            patient.                           - Resume previous diet.                           - Continue present medications.                           -  Repeat colonoscopy in 2 years for surveillance. Jerene Bears, MD 08/02/2016 8:42:59 AM This report has been signed electronically.

## 2016-08-03 ENCOUNTER — Telehealth: Payer: Self-pay

## 2016-08-03 ENCOUNTER — Telehealth: Payer: Self-pay | Admitting: *Deleted

## 2016-08-03 NOTE — Telephone Encounter (Signed)
Name identifier, left message, follow-up 

## 2016-08-03 NOTE — Telephone Encounter (Signed)
  Follow up Call-  Call back number 08/02/2016 11/25/2015 12/27/2014  Post procedure Call Back phone  # [phone]  Permission to leave phone message Yes Yes Yes  Some recent data might be hidden    Patient was called for follow up after his procedure on 08/02/2016. No answer at the number given for follow up phone call. A message was left on the answering machine.

## 2016-11-16 DIAGNOSIS — Z125 Encounter for screening for malignant neoplasm of prostate: Secondary | ICD-10-CM | POA: Diagnosis not present

## 2016-11-16 DIAGNOSIS — Z0000 Encounter for general adult medical examination without abnormal findings: Secondary | ICD-10-CM | POA: Diagnosis not present

## 2016-11-16 DIAGNOSIS — R3129 Other microscopic hematuria: Secondary | ICD-10-CM | POA: Diagnosis not present

## 2016-11-16 DIAGNOSIS — R8299 Other abnormal findings in urine: Secondary | ICD-10-CM | POA: Diagnosis not present

## 2016-11-21 DIAGNOSIS — Z6825 Body mass index (BMI) 25.0-25.9, adult: Secondary | ICD-10-CM | POA: Diagnosis not present

## 2016-11-21 DIAGNOSIS — H43819 Vitreous degeneration, unspecified eye: Secondary | ICD-10-CM | POA: Diagnosis not present

## 2016-11-21 DIAGNOSIS — F418 Other specified anxiety disorders: Secondary | ICD-10-CM | POA: Diagnosis not present

## 2016-11-21 DIAGNOSIS — Z0000 Encounter for general adult medical examination without abnormal findings: Secondary | ICD-10-CM | POA: Diagnosis not present

## 2016-11-21 DIAGNOSIS — I808 Phlebitis and thrombophlebitis of other sites: Secondary | ICD-10-CM | POA: Diagnosis not present

## 2016-11-21 DIAGNOSIS — D126 Benign neoplasm of colon, unspecified: Secondary | ICD-10-CM | POA: Diagnosis not present

## 2016-11-21 DIAGNOSIS — Z23 Encounter for immunization: Secondary | ICD-10-CM | POA: Diagnosis not present

## 2016-11-21 DIAGNOSIS — Z1389 Encounter for screening for other disorder: Secondary | ICD-10-CM | POA: Diagnosis not present

## 2016-11-21 DIAGNOSIS — R0789 Other chest pain: Secondary | ICD-10-CM | POA: Diagnosis not present

## 2016-11-21 DIAGNOSIS — R3129 Other microscopic hematuria: Secondary | ICD-10-CM | POA: Diagnosis not present

## 2016-11-22 DIAGNOSIS — Z1212 Encounter for screening for malignant neoplasm of rectum: Secondary | ICD-10-CM | POA: Diagnosis not present

## 2016-12-13 ENCOUNTER — Ambulatory Visit (INDEPENDENT_AMBULATORY_CARE_PROVIDER_SITE_OTHER): Payer: BLUE CROSS/BLUE SHIELD | Admitting: Podiatry

## 2016-12-13 ENCOUNTER — Encounter: Payer: Self-pay | Admitting: Podiatry

## 2016-12-13 DIAGNOSIS — B351 Tinea unguium: Secondary | ICD-10-CM

## 2016-12-13 DIAGNOSIS — L601 Onycholysis: Secondary | ICD-10-CM | POA: Diagnosis not present

## 2016-12-13 DIAGNOSIS — Q828 Other specified congenital malformations of skin: Secondary | ICD-10-CM

## 2016-12-13 MED ORDER — TERBINAFINE HCL 250 MG PO TABS: 250.0000 mg | ORAL_TABLET | Freq: Every day | ORAL | 0 refills | Status: DC

## 2016-12-13 NOTE — Patient Instructions (Signed)

## 2016-12-13 NOTE — Addendum Note (Signed)
Addended by: Tomi Bamberger on: 12/13/2016 05:20 PM   Modules accepted: Orders

## 2016-12-13 NOTE — Progress Notes (Signed)
Subjective:     Patient ID: Jimmy Edwards, male   DOB: Mar 08, 1961, 56 y.o.   MRN: [id number]  HPI 56 year old male presents the office today for concerns of his toenails becoming thick, discolored. He states the left fourth toenail has become thick and discolored and instrument become painful. He says the right big toenail did get injured back in 2005 year later started to follow-up in the toenails fallen off intermittently. He denies any redness or drainage from the toenail sites. He's had no recent treatment for either of these issues. He has no other complaints today. This toenails do get painful at times and certain shoes if there's pressure pressure due to the thickening. No other complaints today.  Review of Systems  All other systems reviewed and are negative.      Objective:   Physical Exam General: AAO x3, NAD  Dermatological: Nails are hypertrophic, dystrophic, brittle, discolored, elongated 10. No surrounding redness or drainage. There is no tenderness the nails. The right hallux toenail significantly hypertrophic and dystrophic is loosely underlying nail bed. Hyperkeratotic lesion right foot second metatarsal 3. No Ulceration, drainage or any signs infection. No evidence of verruca. No open lesions or pre-ulcerative lesions are identified today.  Vascular: Dorsalis Pedis artery and Posterior Tibial artery pedal pulses are 2/4 bilateral with immedate capillary fill time. Pedal hair growth present. There is no pain with calf compression, swelling, warmth, erythema.   Neruologic: Grossly intact via light touch bilateral. Vibratory intact via tuning fork bilateral. Protective threshold with Semmes Wienstein monofilament intact to all pedal sites bilateral.   Musculoskeletal: No gross boney pedal deformities bilateral. No pain, crepitus, or limitation noted with foot and ankle range of motion bilateral. Muscular strength 5/5 in all groups tested bilateral.  Gait: Unassisted,  Nonantalgic.      Assessment:     56 year old male with onychodystrophy likely onychomycosis; porokeratosis   Plan:     -Treatment options discussed including all alternatives, risks, and complications -Etiology of symptoms were discussed -I debrided the toenails and they were sent to [facility] for pathology. However given the symptoms of systemic onychomycosis he wishes to have her start treatment. Prescribed Lamisil by order CBC as well as LT. He has not started medication to lie: Results of the blood work. Discussed side effects the medication. Medication: The office immediately should any occur. Upon debridement the right hallux toenail the majority the toenail did come off. -Debrided hyperkeratotic lesion right foot metatarsal 3. Upon debridement there is no underlying ulceration, drainage or any clinical signs of infection. Area was cleaned and had was placed followed by salicylic acid and a bandage. Post procedure tractions were discussed. -Follow up 6 weeks or sooner if needed. Call any questions or concerns meantime.  Celesta Gentile, DPM

## 2016-12-28 ENCOUNTER — Telehealth: Payer: Self-pay | Admitting: *Deleted

## 2016-12-28 NOTE — Telephone Encounter (Addendum)
-----   Message from Trula Slade, DPM sent at 12/24/2016  7:58 AM EDT ----- On lamisil.- please let him know it does shoe fungus and we will continue lamisil. 12/28/2016-I informed pt of Dr. Leigh Aurora orders.

## 2017-01-16 DIAGNOSIS — B351 Tinea unguium: Secondary | ICD-10-CM | POA: Diagnosis not present

## 2017-01-17 LAB — CBC WITH DIFFERENTIAL/PLATELET
Basophils Absolute: 67 cells/uL (ref 0–200)
Basophils Relative: 1 %
EOS ABS: 134 {cells}/uL (ref 15–500)
Eosinophils Relative: 2 %
HEMATOCRIT: 44.9 % (ref 38.5–50.0)
HEMOGLOBIN: 15.4 g/dL (ref 13.2–17.1)
Lymphocytes Relative: 30 %
Lymphs Abs: 2010 cells/uL (ref 850–3900)
MCH: 31 pg (ref 27.0–33.0)
MCHC: 34.3 g/dL (ref 32.0–36.0)
MCV: 90.3 fL (ref 80.0–100.0)
MONO ABS: 469 {cells}/uL (ref 200–950)
MPV: 10.2 fL (ref 7.5–12.5)
Monocytes Relative: 7 %
NEUTROS ABS: 4020 {cells}/uL (ref 1500–7800)
Neutrophils Relative %: 60 %
PLATELETS: 218 10*3/uL (ref 140–400)
RBC: 4.97 MIL/uL (ref 4.20–5.80)
RDW: 13.3 % (ref 11.0–15.0)
WBC: 6.7 10*3/uL (ref 3.8–10.8)

## 2017-01-17 LAB — HEPATIC FUNCTION PANEL
ALT: 21 U/L (ref 9–46)
AST: 31 U/L (ref 10–35)
Albumin: 4.1 g/dL (ref 3.6–5.1)
Alkaline Phosphatase: 69 U/L (ref 40–115)
BILIRUBIN DIRECT: 0.1 mg/dL (ref <=0.2)
BILIRUBIN INDIRECT: 0.3 mg/dL (ref 0.2–1.2)
Total Bilirubin: 0.4 mg/dL (ref 0.2–1.2)
Total Protein: 6.7 g/dL (ref 6.1–8.1)

## 2017-01-24 ENCOUNTER — Ambulatory Visit: Payer: BLUE CROSS/BLUE SHIELD | Admitting: Podiatry

## 2017-01-25 ENCOUNTER — Encounter: Payer: Self-pay | Admitting: Podiatry

## 2017-01-25 ENCOUNTER — Ambulatory Visit (INDEPENDENT_AMBULATORY_CARE_PROVIDER_SITE_OTHER): Payer: BLUE CROSS/BLUE SHIELD | Admitting: Podiatry

## 2017-01-25 DIAGNOSIS — Q828 Other specified congenital malformations of skin: Secondary | ICD-10-CM | POA: Diagnosis not present

## 2017-01-25 DIAGNOSIS — B351 Tinea unguium: Secondary | ICD-10-CM

## 2017-01-31 NOTE — Progress Notes (Signed)
Subjective: 56 year old male presents the office today for follow-up evaluation after starting Lamisil. He states he is not noticed much of a difference thur far with the Lamisil. Denies any pain in the tensor denies any redness or drainage or any swelling. He denies any complications from the anus [place] and side effects. He also states that the callus that he had on the right foot is no longer cause any pain. No complications Of the treatment last appointment. Denies any systemic complaints such as fevers, chills, nausea, vomiting. No acute changes since last appointment, and no other complaints at this time.   Objective: AAO x3, NAD DP/PT pulses palpable bilaterally, CRT less than 3 seconds Nails continue be hypertrophic, dystrophic, discolored. There is mild clearing on the proximal nail borders but overall nails appear to be unchanged. There is no ascending redness or drainage and there is no clinical signs of infection identified today. Hyperkeratotic lesion on the right foot was debrided and the underlying skin intact without any ulceration or drainage and this appears to be much improved. No pain. No open lesions or pre-ulcerative lesions.  No pain with calf compression, swelling, warmth, erythema  Assessment: Onychomycosis results porokeratosis  Plan: -All treatment options discussed with the patient including all alternatives, risks, complications.  -Continue with Lamisil for total 3 months. Continue to monitor for any side effects. At the next 3 months and may continue fourth month if needed. He'll fourth month for recheck blood work. Hervey Ard debridement hyperkeratotic lesion right foot. -Patient encouraged to call the office with any questions, concerns, change in symptoms.   Celesta Gentile, DPM

## 2017-02-20 ENCOUNTER — Telehealth: Payer: Self-pay | Admitting: Internal Medicine

## 2017-02-20 NOTE — Telephone Encounter (Signed)
He has hx of hemorrhoids, but also anal fissure Can place directly in banding slot to discuss and band if appropriate

## 2017-02-20 NOTE — Telephone Encounter (Signed)
Left message for pt to call back.  Spoke with pt and he cannot come on Friday for appt. Pt will call back at the beginning of July to see if September appts are open.

## 2017-02-20 NOTE — Telephone Encounter (Signed)
Please advise 

## 2017-04-05 ENCOUNTER — Encounter: Payer: Self-pay | Admitting: *Deleted

## 2017-05-14 ENCOUNTER — Encounter: Payer: BLUE CROSS/BLUE SHIELD | Admitting: Internal Medicine

## 2017-05-15 ENCOUNTER — Encounter: Payer: BLUE CROSS/BLUE SHIELD | Admitting: Internal Medicine

## 2017-05-23 ENCOUNTER — Ambulatory Visit (INDEPENDENT_AMBULATORY_CARE_PROVIDER_SITE_OTHER): Payer: BLUE CROSS/BLUE SHIELD | Admitting: Internal Medicine

## 2017-05-23 ENCOUNTER — Encounter: Payer: Self-pay | Admitting: Internal Medicine

## 2017-05-23 ENCOUNTER — Encounter (INDEPENDENT_AMBULATORY_CARE_PROVIDER_SITE_OTHER): Payer: Self-pay

## 2017-05-23 VITALS — BP 98/70 | HR 72 | Ht 68.0 in | Wt 164.5 lb

## 2017-05-23 DIAGNOSIS — K219 Gastro-esophageal reflux disease without esophagitis: Secondary | ICD-10-CM | POA: Diagnosis not present

## 2017-05-23 DIAGNOSIS — R142 Eructation: Secondary | ICD-10-CM | POA: Diagnosis not present

## 2017-05-23 DIAGNOSIS — K648 Other hemorrhoids: Secondary | ICD-10-CM

## 2017-05-23 DIAGNOSIS — R102 Pelvic and perineal pain: Secondary | ICD-10-CM

## 2017-05-23 MED ORDER — OMEPRAZOLE 40 MG PO CPDR: 40.0000 mg | DELAYED_RELEASE_CAPSULE | Freq: Every day | ORAL | 3 refills | Status: DC

## 2017-05-23 NOTE — Patient Instructions (Signed)
Your physician has requested that you go to the basement for the following lab work before leaving today: Urinalysis with culture  We have sent the following medications to your pharmacy for you to pick up at your convenience: Omeprazole 40 mg daily (increased from 20 mg daily)  HEMORRHOID BANDING PROCEDURE    FOLLOW-UP CARE   1. The procedure you have had should have been relatively painless since the banding of the area involved does not have nerve endings and there is no pain sensation.  The rubber band cuts off the blood supply to the hemorrhoid and the band may fall off as soon as 48 hours after the banding (the band may occasionally be seen in the toilet bowl following a bowel movement). You may notice a temporary feeling of fullness in the rectum which should respond adequately to plain Tylenol or Motrin.  2. Following the banding, avoid strenuous exercise that evening and resume full activity the next day.  A sitz bath (soaking in a warm tub) or bidet is soothing, and can be useful for cleansing the area after bowel movements.     3. To avoid constipation, take two tablespoons of natural wheat bran, natural oat bran, flax, Benefiber or any over the counter fiber supplement and increase your water intake to 7-8 glasses daily.    4. Unless you have been prescribed anorectal medication, do not put anything inside your rectum for two weeks: No suppositories, enemas, fingers, etc.  5. Occasionally, you may have more bleeding than usual after the banding procedure.  This is often from the untreated hemorrhoids rather than the treated one.  Don't be concerned if there is a tablespoon or so of blood.  If there is more blood than this, lie flat with your bottom higher than your head and apply an ice pack to the area. If the bleeding does not stop within a half an hour or if you feel faint, call our office at [phone] or go to the emergency room.  6. Problems are not common; however, if  there is a substantial amount of bleeding, severe pain, chills, fever or difficulty passing urine (very rare) or other problems, you should call us at [phone] or report to the nearest emergency room.  7. Do not stay seated continuously for more than 2-3 hours for a day or two after the procedure.  Tighten your buttock muscles 10-15 times every two hours and take 10-15 deep breaths every 1-2 hours.  Do not spend more than a few minutes on the toilet if you cannot empty your bowel; instead re-visit the toilet at a later time.

## 2017-05-23 NOTE — Progress Notes (Signed)
Subjective:    Patient ID: Jimmy Edwards, male    DOB: 1960/11/26, 56 y.o.   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 He is here alone today.  He reports that his internal hemorrhoids continue to be a frequent annoyance. Primarily prolapse is his biggest complaint. He will feel prolapsing hemorrhoidal tissue after bowel movement which will be present for some time. He reports he can feel this when he sits down and eventually the tissue will retreat back into the distal rectum. He has had issues with bleeding internal hemorrhoids in the past but this has not been an issue of late.   Separate from his hemorrhoids he has felt a burning sensation in his suprapubic abdomen for the last several days. He notices this while driving. It does not feel like kidney stone pain. He has some urinary urgency but no dysuria, hematuria or change in his urinary stream. He is curious about a possible urinary tract infection. He has previously been seen by urology but not recently.  Separate from this he reports ongoing issues with eructation. The heartburn has resolved completely and he is using omeprazole 20 mg daily. He had a previous upper endoscopy which I performed in May 2016. This revealed a 3 cm hiatal hernia but was otherwise unremarkable.  Review of Systems   as per history of present illness, otherwise negative   Current Medications, Allergies, Past Medical History, Past Surgical History, Family History and Social History were reviewed in [employer] record.   Objective:   Physical Exam BP 98/70   Pulse 72   Ht 5\' 8"  (1.727 m)   Wt 164  lb 8 oz (74.6 kg)   BMI 25.01 kg/m  Constitutional: Well-developed and well-nourished. No distress. HEENT: Normocephalic and atraumatic. Conjunctivae are normal.  No scleral icterus. Neck: Neck supple. Trachea midline. Cardiovascular: Normal rate, regular rhythm and intact distal pulses. Pulmonary/chest: Effort normal and breath sounds normal. No wheezing, rales or rhonchi. Abdominal: Soft, nontender, nondistended. Bowel sounds active throughout. Extremities: no clubbing, cyanosis, or edema Neurological: Alert and oriented to person place and time. Skin: Skin is warm and dry. Psychiatric: Normal mood and affect. Behavior is normal.      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  1. Belching/eructation -- possibly secondary to uncontrolled reflux though his heartburn has improved on low-dose omeprazole. I recommended we try a higher dose of omeprazole 1 month see if this symptom improves. Will increase omeprazole to 40 mg once daily 1 month. If symptoms fail to improve can consider barium esophagram to rule out esophageal dysmotility.   2. Suprapubic burning discomfort/urinary urgency  -- check urinalysis with culture. If this continues he will follow-up with Dr. Joylene Draft or his urologist   3. Internal hemorrhoids  -- we discussed hemorrhoidal banding and after discussion of the risk benefits and alternatives he wishes to proceed    PROCEDURE NOTE:  The patient presents  with symptomatic grade 2-3 internal hemorrhoids, requesting rubber band ligation of his hemorrhoidal disease.  All risks, benefits and alternative forms of therapy were described and informed consent  was obtained.   The anorectum was pre-medicated with 0.125% nitroglycerin ointment The decision was made to band the right anterior (RA) internal hemorrhoid, and the [place] was used to perform band ligation without complication.   Digital anorectal examination was then performed to assure proper positioning of the band, and to adjust the banded tissue as required.  The patient was discharged home without pain or other issues.  Dietary and behavioral recommendations were given and along with follow-up instructions.      The patient will return as scheduled for follow-up and possible additional banding as required. No complications were encountered and the patient tolerated the procedure well.  4. History of colon polyp -- requiring partial cecum removal with some recurrence at surgical site. He is on a surveillance interval and we will repeat colonoscopy at the two-year mark which would be December 2019   25 minutes spent with the patient today. Greater than 50% was spent in counseling and coordination of care with the patient

## 2017-05-24 ENCOUNTER — Other Ambulatory Visit (INDEPENDENT_AMBULATORY_CARE_PROVIDER_SITE_OTHER): Payer: BLUE CROSS/BLUE SHIELD

## 2017-05-24 DIAGNOSIS — R102 Pelvic and perineal pain: Secondary | ICD-10-CM

## 2017-05-24 LAB — URINALYSIS, ROUTINE W REFLEX MICROSCOPIC
Bilirubin Urine: NEGATIVE
KETONES UR: NEGATIVE
LEUKOCYTES UA: NEGATIVE
Nitrite: NEGATIVE
PH: 6 (ref 5.0–8.0)
SPECIFIC GRAVITY, URINE: 1.025 (ref 1.000–1.030)
Total Protein, Urine: NEGATIVE
UROBILINOGEN UA: 0.2 (ref 0.0–1.0)
Urine Glucose: NEGATIVE

## 2017-05-25 LAB — URINE CULTURE
MICRO NUMBER: 81078650
SPECIMEN QUALITY: ADEQUATE

## 2017-05-29 ENCOUNTER — Telehealth: Payer: Self-pay | Admitting: Internal Medicine

## 2017-05-29 NOTE — Telephone Encounter (Signed)
Spoke with pt and he is aware, see result note. 

## 2017-05-29 NOTE — Telephone Encounter (Signed)
Patient returning Linda's call about lab results from 9.28.18. Pt states he will be on the road so okay to leave message with results on vm.

## 2017-06-17 ENCOUNTER — Telehealth: Payer: Self-pay | Admitting: Internal Medicine

## 2017-06-17 ENCOUNTER — Other Ambulatory Visit: Payer: Self-pay

## 2017-06-17 NOTE — Telephone Encounter (Signed)
Barium esophagram with tablet; belching/eructation/GERD Evaluate esophageal motility

## 2017-06-17 NOTE — Telephone Encounter (Signed)
Left a message to call back. Would like to know his time or date restrictions before scheduling

## 2017-06-18 ENCOUNTER — Other Ambulatory Visit: Payer: Self-pay

## 2017-06-18 DIAGNOSIS — K219 Gastro-esophageal reflux disease without esophagitis: Secondary | ICD-10-CM

## 2017-06-18 DIAGNOSIS — R142 Eructation: Secondary | ICD-10-CM

## 2017-06-18 NOTE — Telephone Encounter (Signed)
Pt scheduled for barium swallow at [facility] 06/27/17@9 :30am, pt to arrive there at 9:15am. Pt to be NPO after 6:30am. Pt aware of appt.

## 2017-06-18 NOTE — Telephone Encounter (Signed)
Pt is returning phone call and best call back # is [phone].

## 2017-06-19 ENCOUNTER — Encounter: Payer: BLUE CROSS/BLUE SHIELD | Admitting: Internal Medicine

## 2017-06-27 ENCOUNTER — Ambulatory Visit (HOSPITAL_COMMUNITY)
Admission: RE | Admit: 2017-06-27 | Discharge: 2017-06-27 | Disposition: A | Discharge status: Home / Self Care | Payer: BLUE CROSS/BLUE SHIELD | Source: Ambulatory Visit | Attending: Internal Medicine | Admitting: Internal Medicine

## 2017-06-27 DIAGNOSIS — K449 Diaphragmatic hernia without obstruction or gangrene: Secondary | ICD-10-CM | POA: Diagnosis not present

## 2017-06-27 DIAGNOSIS — K219 Gastro-esophageal reflux disease without esophagitis: Secondary | ICD-10-CM | POA: Diagnosis not present

## 2017-06-27 DIAGNOSIS — R142 Eructation: Secondary | ICD-10-CM | POA: Diagnosis not present

## 2017-07-12 ENCOUNTER — Telehealth: Payer: Self-pay | Admitting: Internal Medicine

## 2017-07-12 MED ORDER — OMEPRAZOLE 20 MG PO CPDR: 20.0000 mg | DELAYED_RELEASE_CAPSULE | Freq: Every day | ORAL | 1 refills | Status: DC

## 2017-07-12 NOTE — Telephone Encounter (Signed)
Updated rx sent

## 2017-07-30 ENCOUNTER — Encounter: Payer: BLUE CROSS/BLUE SHIELD | Admitting: Internal Medicine

## 2017-08-02 ENCOUNTER — Encounter: Payer: BLUE CROSS/BLUE SHIELD | Admitting: Internal Medicine

## 2017-11-06 ENCOUNTER — Telehealth: Payer: Self-pay | Admitting: Internal Medicine

## 2017-11-06 NOTE — Telephone Encounter (Signed)
Pt calling to schedule Hem banding. Several appts have been cancelled by the patient. Please advise if ok to reschedule banding.

## 2017-11-06 NOTE — Telephone Encounter (Signed)
Yes, next available, which may be June (June schedule to be release soon)

## 2017-11-06 NOTE — Telephone Encounter (Signed)
Patient states he is ready to schedule hem banding #2. Last scheduled banding was canceled for 12.7.18. Please advise.

## 2017-11-06 NOTE — Telephone Encounter (Signed)
See comment from Dr. Hilarie Fredrickson

## 2017-11-06 NOTE — Telephone Encounter (Signed)
Left message on voicemail for patient to call back and schedule banding.

## 2017-12-10 ENCOUNTER — Ambulatory Visit: Payer: BLUE CROSS/BLUE SHIELD | Admitting: Internal Medicine

## 2017-12-10 ENCOUNTER — Encounter (INDEPENDENT_AMBULATORY_CARE_PROVIDER_SITE_OTHER): Payer: Self-pay

## 2017-12-10 ENCOUNTER — Encounter: Payer: Self-pay | Admitting: Internal Medicine

## 2017-12-10 VITALS — Ht 68.0 in | Wt 164.6 lb

## 2017-12-10 DIAGNOSIS — K648 Other hemorrhoids: Secondary | ICD-10-CM

## 2017-12-10 NOTE — Patient Instructions (Signed)
You have been scheduled for your 3rd banding on Thursday, 02/13/18 at 3:45 pm.  If you are age 59 or older, your body mass index should be between 23-30. Your Body mass index is 25.03 kg/m. If this is out of the aforementioned range listed, please consider follow up with your Primary Care Provider.  If you are age 90 or younger, your body mass index should be between 19-25. Your Body mass index is 25.03 kg/m. If this is out of the aformentioned range listed, please consider follow up with your Primary Care Provider.   HEMORRHOID BANDING PROCEDURE    FOLLOW-UP CARE   1. The procedure you have had should have been relatively painless since the banding of the area involved does not have nerve endings and there is no pain sensation.  The rubber band cuts off the blood supply to the hemorrhoid and the band may fall off as soon as 48 hours after the banding (the band may occasionally be seen in the toilet bowl following a bowel movement). You may notice a temporary feeling of fullness in the rectum which should respond adequately to plain Tylenol or Motrin.  2. Following the banding, avoid strenuous exercise that evening and resume full activity the next day.  A sitz bath (soaking in a warm tub) or bidet is soothing, and can be useful for cleansing the area after bowel movements.     3. To avoid constipation, take two tablespoons of natural wheat bran, natural oat bran, flax, Benefiber or any over the counter fiber supplement and increase your water intake to 7-8 glasses daily.    4. Unless you have been prescribed anorectal medication, do not put anything inside your rectum for two weeks: No suppositories, enemas, fingers, etc.  5. Occasionally, you may have more bleeding than usual after the banding procedure.  This is often from the untreated hemorrhoids rather than the treated one.  Don't be concerned if there is a tablespoon or so of blood.  If there is more blood than this, lie flat with your  bottom higher than your head and apply an ice pack to the area. If the bleeding does not stop within a half an hour or if you feel faint, call our office at [phone] or go to the emergency room.  6. Problems are not common; however, if there is a substantial amount of bleeding, severe pain, chills, fever or difficulty passing urine (very rare) or other problems, you should call us at [phone] or report to the nearest emergency room.  7. Do not stay seated continuously for more than 2-3 hours for a day or two after the procedure.  Tighten your buttock muscles 10-15 times every two hours and take 10-15 deep breaths every 1-2 hours.  Do not spend more than a few minutes on the toilet if you cannot empty your bowel; instead re-visit the toilet at a later time.

## 2017-12-10 NOTE — Progress Notes (Signed)
Jimmy Edwards a 57 year old male with a history of symptomatic internal hemorrhoids who presents for additional hemorrhoidal banding He had initial hemorrhoidal banding on 05/23/2017 for symptomatic internal hemorrhoids; symptoms prior to banding included frequent prolapse and rectal bleeding  He reports symptoms improved significantly after initial hemorrhoidal banding though in the last 1-2 weeks he has had recurrent hemorrhoidal prolapse and also red blood with passing stool.   PROCEDURE NOTE:  The patient presents with symptomatic grade 3 internal hemorrhoids, requesting rubber band ligation of his hemorrhoidal disease.  All risks, benefits and alternative forms of therapy were described and informed consent was obtained.   The anorectum was pre-medicated with 0.125% nitroglycerin ointment Rectal exam reveals grade 3 prolapsing internal hemorrhoids both right and left The decision was made to band the RP internal hemorrhoid, and the [place] was used to perform band ligation without complication.   Digital anorectal examination was then performed to assure proper positioning of the band, and to adjust the banded tissue as required.  The patient was discharged home without pain or other issues.  Dietary and behavioral recommendations were given and along with follow-up instructions.    The patient will return as scheduled for follow-up and possible additional banding as required. No complications were encountered and the patient tolerated the procedure well.

## 2018-01-02 DIAGNOSIS — Z0000 Encounter for general adult medical examination without abnormal findings: Secondary | ICD-10-CM | POA: Diagnosis not present

## 2018-01-02 DIAGNOSIS — Z125 Encounter for screening for malignant neoplasm of prostate: Secondary | ICD-10-CM | POA: Diagnosis not present

## 2018-01-02 DIAGNOSIS — E7849 Other hyperlipidemia: Secondary | ICD-10-CM | POA: Diagnosis not present

## 2018-01-04 ENCOUNTER — Other Ambulatory Visit: Payer: Self-pay | Admitting: Internal Medicine

## 2018-01-24 DIAGNOSIS — Z0000 Encounter for general adult medical examination without abnormal findings: Secondary | ICD-10-CM | POA: Diagnosis not present

## 2018-01-24 DIAGNOSIS — Z1389 Encounter for screening for other disorder: Secondary | ICD-10-CM | POA: Diagnosis not present

## 2018-01-24 DIAGNOSIS — B351 Tinea unguium: Secondary | ICD-10-CM | POA: Diagnosis not present

## 2018-01-24 DIAGNOSIS — F418 Other specified anxiety disorders: Secondary | ICD-10-CM | POA: Diagnosis not present

## 2018-01-24 DIAGNOSIS — H43813 Vitreous degeneration, bilateral: Secondary | ICD-10-CM | POA: Diagnosis not present

## 2018-01-24 DIAGNOSIS — R0789 Other chest pain: Secondary | ICD-10-CM | POA: Diagnosis not present

## 2018-02-13 ENCOUNTER — Encounter: Payer: BLUE CROSS/BLUE SHIELD | Admitting: Internal Medicine

## 2018-02-20 ENCOUNTER — Ambulatory Visit: Payer: BLUE CROSS/BLUE SHIELD | Admitting: Internal Medicine

## 2018-02-20 ENCOUNTER — Encounter: Payer: Self-pay | Admitting: Internal Medicine

## 2018-02-20 VITALS — BP 130/90 | HR 75 | Ht 68.0 in | Wt 167.0 lb

## 2018-02-20 DIAGNOSIS — K642 Third degree hemorrhoids: Secondary | ICD-10-CM

## 2018-02-20 DIAGNOSIS — K648 Other hemorrhoids: Secondary | ICD-10-CM

## 2018-02-20 NOTE — Patient Instructions (Signed)
Please follow up as needed.  HEMORRHOID BANDING PROCEDURE    FOLLOW-UP CARE   1. The procedure you have had should have been relatively painless since the banding of the area involved does not have nerve endings and there is no pain sensation.  The rubber band cuts off the blood supply to the hemorrhoid and the band may fall off as soon as 48 hours after the banding (the band may occasionally be seen in the toilet bowl following a bowel movement). You may notice a temporary feeling of fullness in the rectum which should respond adequately to plain Tylenol or Motrin.  2. Following the banding, avoid strenuous exercise that evening and resume full activity the next day.  A sitz bath (soaking in a warm tub) or bidet is soothing, and can be useful for cleansing the area after bowel movements.     3. To avoid constipation, take two tablespoons of natural wheat bran, natural oat bran, flax, Benefiber or any over the counter fiber supplement and increase your water intake to 7-8 glasses daily.    4. Unless you have been prescribed anorectal medication, do not put anything inside your rectum for two weeks: No suppositories, enemas, fingers, etc.  5. Occasionally, you may have more bleeding than usual after the banding procedure.  This is often from the untreated hemorrhoids rather than the treated one.  Don't be concerned if there is a tablespoon or so of blood.  If there is more blood than this, lie flat with your bottom higher than your head and apply an ice pack to the area. If the bleeding does not stop within a half an hour or if you feel faint, call our office at [phone] or go to the emergency room.  6. Problems are not common; however, if there is a substantial amount of bleeding, severe pain, chills, fever or difficulty passing urine (very rare) or other problems, you should call us at [phone] or report to the nearest emergency room.  7. Do not stay seated continuously for more  than 2-3 hours for a day or two after the procedure.  Tighten your buttock muscles 10-15 times every two hours and take 10-15 deep breaths every 1-2 hours.  Do not spend more than a few minutes on the toilet if you cannot empty your bowel; instead re-visit the toilet at a later time.     

## 2018-02-20 NOTE — Progress Notes (Signed)
Jimmy Edwards is a 57 year old male with a history of symptomatic internal hemorrhoids presenting for additional hemorrhoidal banding Banding performed initially on 05/23/2017 and then again on 12/10/2017 Hemorrhoidal symptoms have improved which initially were hemorrhoidal prolapse and rectal bleeding   PROCEDURE NOTE:  The patient presents with symptomatic grade 3 internal hemorrhoids, requesting rubber band ligation of his hemorrhoidal disease.  All risks, benefits and alternative forms of therapy were described and informed consent was obtained.   The anorectum was pre-medicated with 0.125% nitroglycerin ointment The decision was made to band the LL internal hemorrhoid, and the [place] was used to perform band ligation without complication.   Digital anorectal examination was then performed to assure proper positioning of the band, and to adjust the banded tissue as required.  The patient was discharged home without pain or other issues.  Dietary and behavioral recommendations were given and along with follow-up instructions.      The patient will return as needed for follow-up and possible additional banding as required. No complications were encountered and the patient tolerated the procedure well.'  Addendum: About 90 minutes after the patient left his office visit he came back with increasing rectal pressure and discomfort.  Some radiating discomfort down his left thigh.  Rectal exam performed, the band was loosened slightly with resolution of symptoms.

## 2018-04-05 ENCOUNTER — Other Ambulatory Visit: Payer: Self-pay | Admitting: Internal Medicine

## 2018-05-23 ENCOUNTER — Telehealth: Payer: Self-pay | Admitting: Internal Medicine

## 2018-05-23 NOTE — Telephone Encounter (Signed)
Pt thought Dr. Zella Richer had retired and wanted to know who Dr. Hilarie Fredrickson would recommend. Encouraged pt to call [facility] as we have not heard he has retired, pt given phone number to call.

## 2018-05-23 NOTE — Telephone Encounter (Signed)
Pt calling regarding a partial colectomy that he had done 3 years ago to remove a polyp. He stated that surgeon retired so he is wondering who Dr. Hilarie Fredrickson would recommend now. Pls call him.

## 2018-09-25 ENCOUNTER — Telehealth: Payer: Self-pay | Admitting: Internal Medicine

## 2018-09-25 NOTE — Telephone Encounter (Signed)
Pt would like a schedule a hemorrhoid banding.

## 2018-09-25 NOTE — Telephone Encounter (Signed)
OK to schedule for banding, pt not on blood thinners.

## 2018-10-02 ENCOUNTER — Other Ambulatory Visit: Payer: Self-pay | Admitting: Internal Medicine

## 2018-10-14 ENCOUNTER — Encounter: Payer: Self-pay | Admitting: Internal Medicine

## 2019-01-29 IMAGING — RF DG ESOPHAGUS
7 series · 17 of 21 positions shown · non-contrast
Comparison: None.

CLINICAL DATA: Belching. GERD. History of EGD 2 years ago with
hiatal hernia.

EXAM:
ESOPHOGRAM / BARIUM SWALLOW / BARIUM TABLET STUDY
TECHNIQUE: Combined double contrast and single contrast examination performed
using effervescent crystals, thick barium liquid, and thin barium
liquid. The patient was observed with fluoroscopy swallowing a 13 mm
barium sulphate tablet.
FLUOROSCOPY TIME:  Fluoroscopy Time:  1.2 minutes
Radiation Exposure Index (if provided by the fluoroscopic device):
6.4 mGy
Number of Acquired Spot Images: 0

[Series 1: cp_standard · 0.51mm/px · 3 of 18 frames shown (1 of 7)]
[frame 1/18]
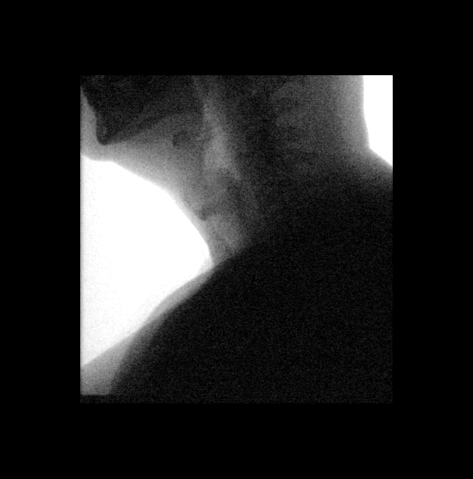
[frame 3/18]
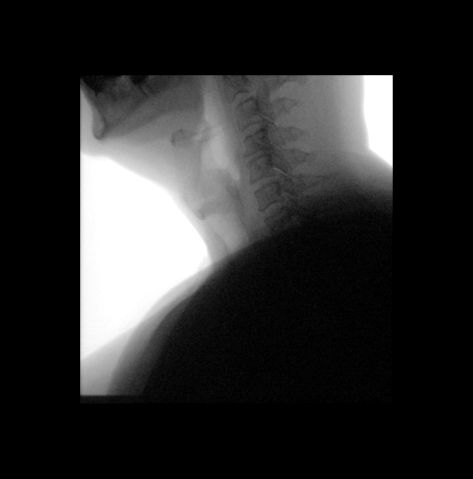
[frame 16/18]
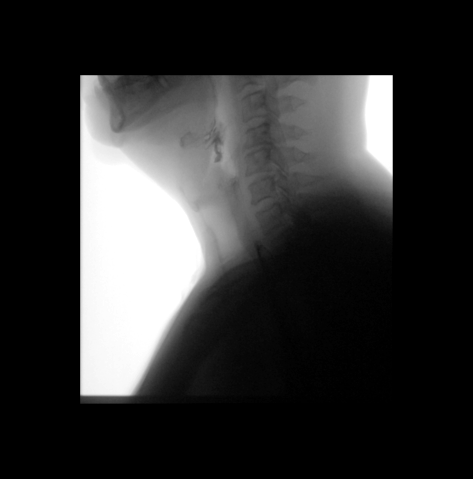

[Series 2: cp_standard · 0.26mm/px · 1 of 1 slices shown (2 of 7)]
[im 1/1]
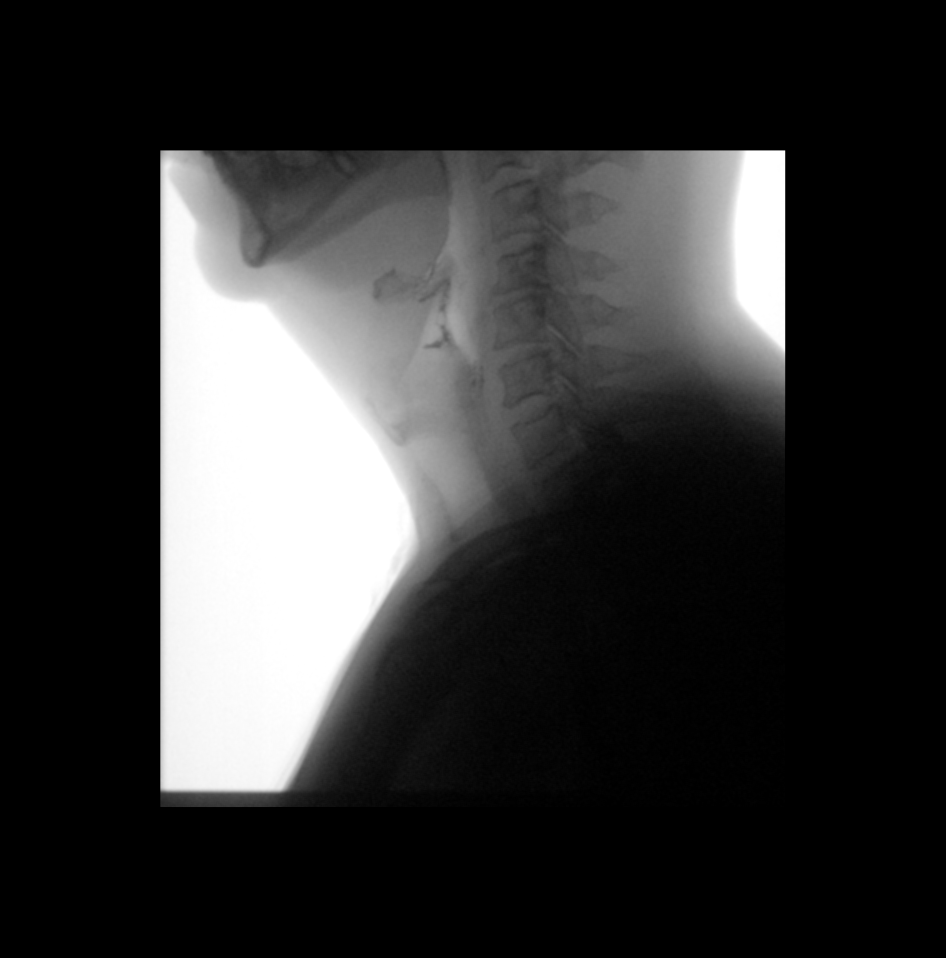

[Series 3: cp_standard · 0.52mm/px · 3 of 31 frames shown (3 of 7)]
[frame 1/31]
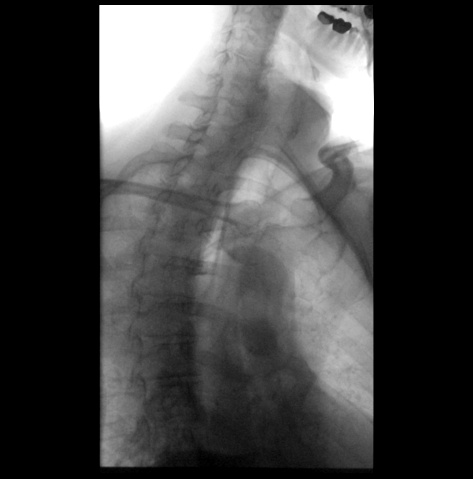
[frame 5/31]
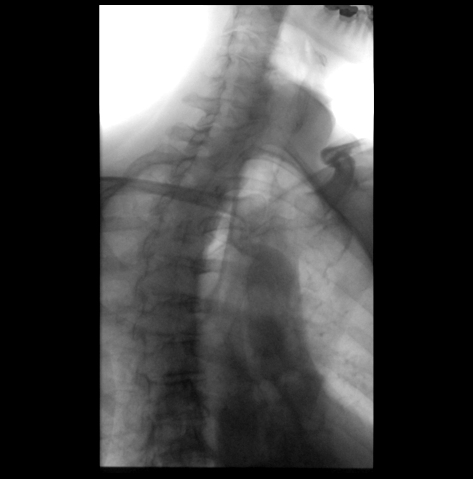
[frame 27/31]
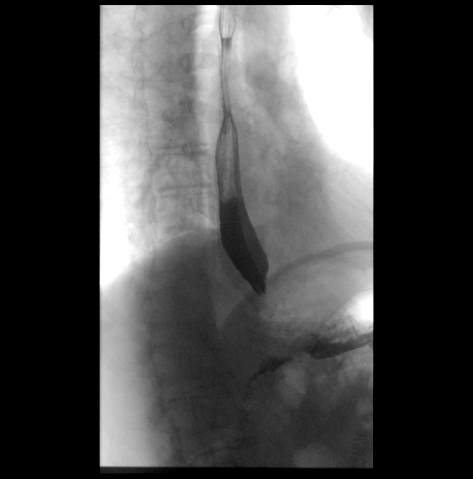

[Series 4: cp_standard · 0.52mm/px · 3 of 17 frames shown (4 of 7)]
[frame 3/17]
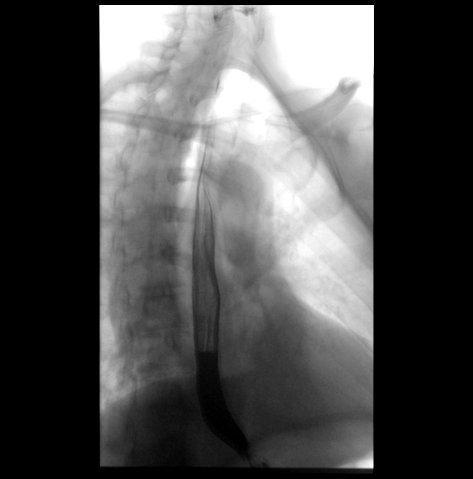
[frame 9/17]
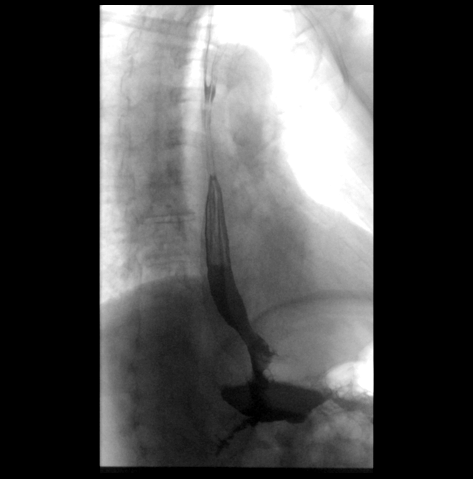
[frame 15/17]
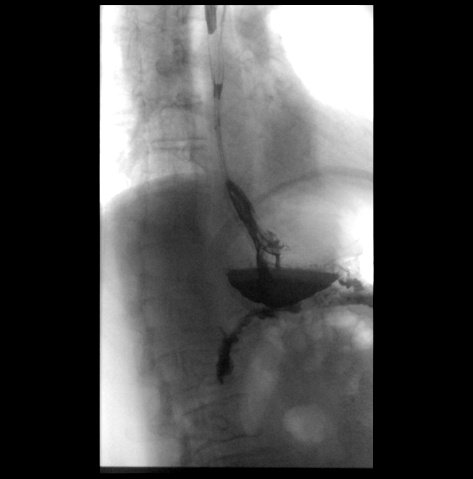

[Series 5: cp_standard · 0.34mm/px · 3 of 18 frames shown (5 of 7)]
[frame 3/18]
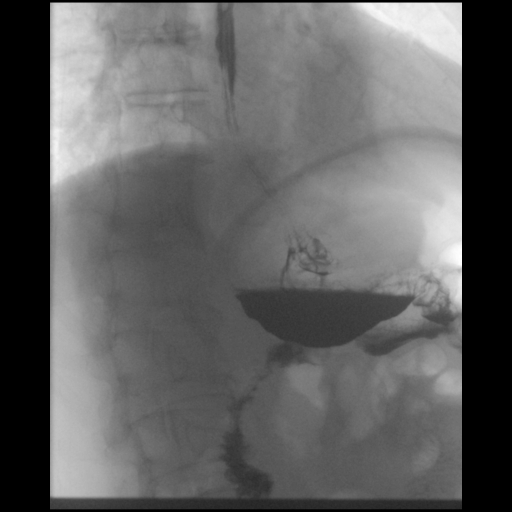
[frame 16/18]
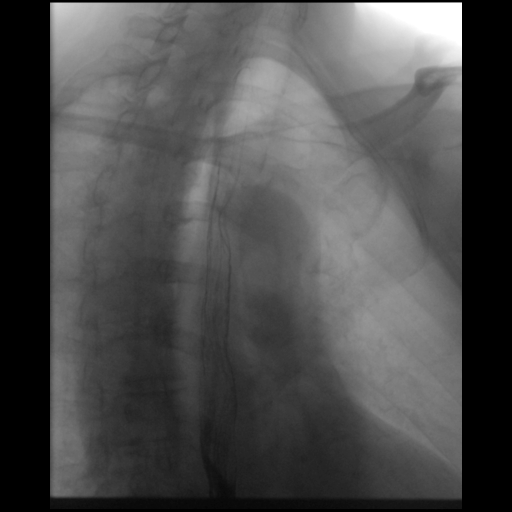
[frame 17/18]
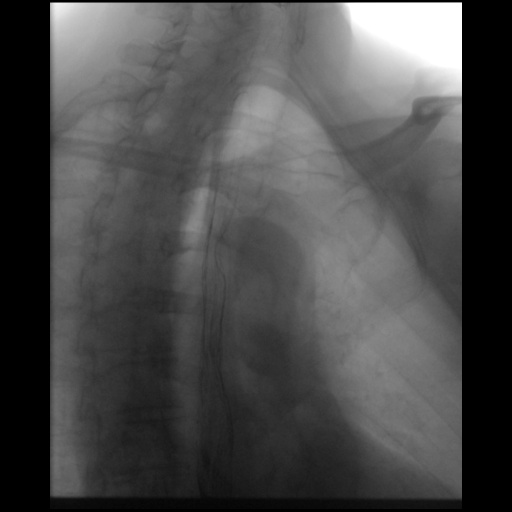

[Series 6: cp_standard · 0.53mm/px · 3 of 32 frames shown (6 of 7)]
[frame 1/32]
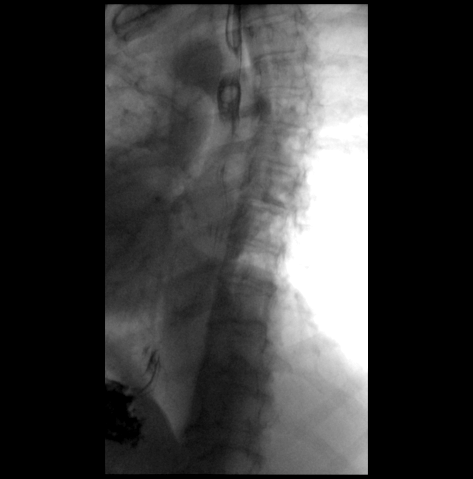
[frame 5/32]
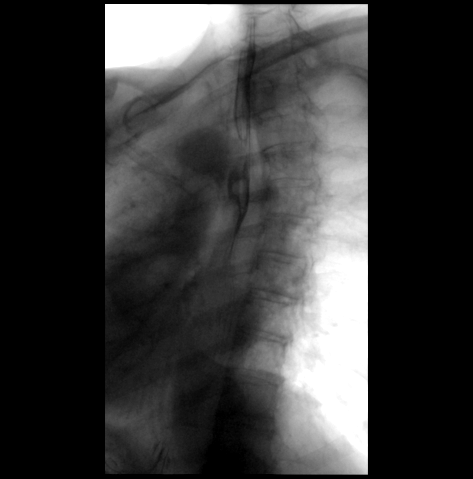
[frame 28/32]
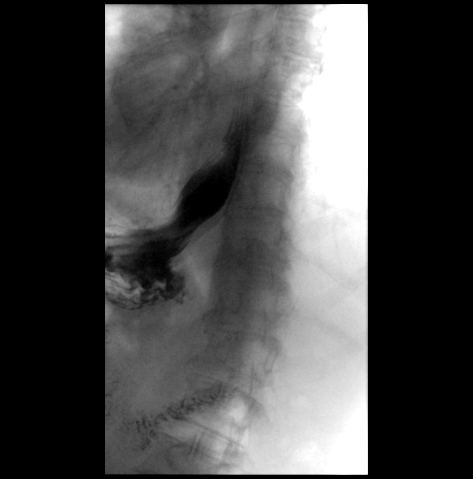

[Series 7: cp_standard · 0.27mm/px · 1 of 1 slices shown (7 of 7)]
[im 1/1]
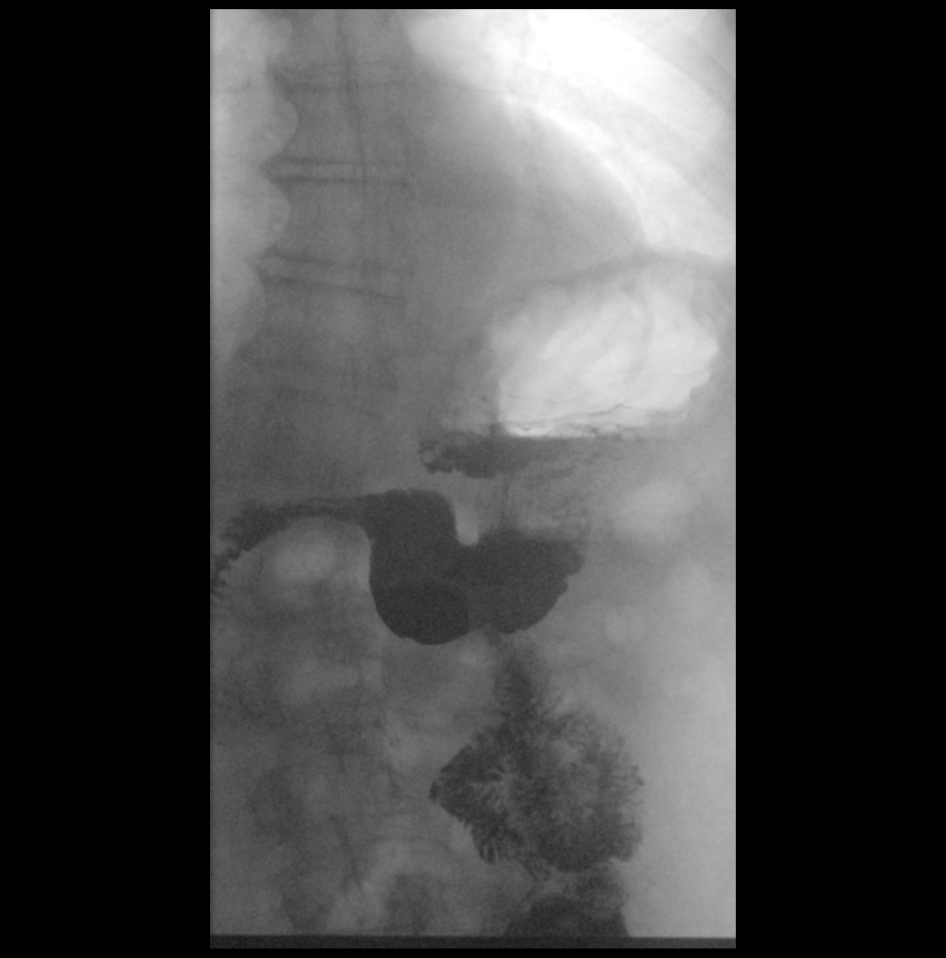

[17 of 21 positions shown; findings below may reference images not displayed]

FINDINGS: Lateral pharyngeal imaging shows no aspiration, diverticulum, or
obstructive process.

Esophagus has normal distensibility and motility. No mucosal lesion
or stricture was encounter. A 13 mm barium tablet easily traversed
the esophagus. When supine, a few gastric folds extends above the
diaphragm, correlating with history of hiatal hernia.

Thoracolumbar scoliosis.  Patient reports this is known.
IMPRESSION: Small and transient hiatal hernia.  Otherwise, unremarkable exam.

## 2019-03-03 DIAGNOSIS — L821 Other seborrheic keratosis: Secondary | ICD-10-CM | POA: Diagnosis not present

## 2019-03-03 DIAGNOSIS — L918 Other hypertrophic disorders of the skin: Secondary | ICD-10-CM | POA: Diagnosis not present

## 2019-03-11 DIAGNOSIS — E7849 Other hyperlipidemia: Secondary | ICD-10-CM | POA: Diagnosis not present

## 2019-03-11 DIAGNOSIS — Z125 Encounter for screening for malignant neoplasm of prostate: Secondary | ICD-10-CM | POA: Diagnosis not present

## 2019-03-11 DIAGNOSIS — Z0000 Encounter for general adult medical examination without abnormal findings: Secondary | ICD-10-CM | POA: Diagnosis not present

## 2019-03-12 DIAGNOSIS — R82998 Other abnormal findings in urine: Secondary | ICD-10-CM | POA: Diagnosis not present

## 2019-03-18 DIAGNOSIS — L918 Other hypertrophic disorders of the skin: Secondary | ICD-10-CM | POA: Diagnosis not present

## 2019-03-18 DIAGNOSIS — B351 Tinea unguium: Secondary | ICD-10-CM | POA: Diagnosis not present

## 2019-03-18 DIAGNOSIS — R3121 Asymptomatic microscopic hematuria: Secondary | ICD-10-CM | POA: Diagnosis not present

## 2019-03-18 DIAGNOSIS — Z0000 Encounter for general adult medical examination without abnormal findings: Secondary | ICD-10-CM | POA: Diagnosis not present

## 2019-03-18 DIAGNOSIS — D126 Benign neoplasm of colon, unspecified: Secondary | ICD-10-CM | POA: Diagnosis not present

## 2019-03-20 DIAGNOSIS — R1084 Generalized abdominal pain: Secondary | ICD-10-CM | POA: Diagnosis not present

## 2019-05-08 DIAGNOSIS — Z23 Encounter for immunization: Secondary | ICD-10-CM | POA: Diagnosis not present

## 2019-06-27 DIAGNOSIS — S0081XA Abrasion of other part of head, initial encounter: Secondary | ICD-10-CM | POA: Diagnosis not present

## 2019-06-27 DIAGNOSIS — S0031XA Abrasion of nose, initial encounter: Secondary | ICD-10-CM | POA: Diagnosis not present

## 2019-06-27 DIAGNOSIS — W228XXA Striking against or struck by other objects, initial encounter: Secondary | ICD-10-CM | POA: Diagnosis not present

## 2019-06-27 DIAGNOSIS — Y999 Unspecified external cause status: Secondary | ICD-10-CM | POA: Diagnosis not present

## 2019-08-18 DIAGNOSIS — D485 Neoplasm of uncertain behavior of skin: Secondary | ICD-10-CM | POA: Diagnosis not present

## 2019-09-15 DIAGNOSIS — R0789 Other chest pain: Secondary | ICD-10-CM | POA: Diagnosis not present

## 2019-09-15 DIAGNOSIS — M62838 Other muscle spasm: Secondary | ICD-10-CM | POA: Diagnosis not present

## 2019-09-15 DIAGNOSIS — F419 Anxiety disorder, unspecified: Secondary | ICD-10-CM | POA: Diagnosis not present

## 2019-09-15 DIAGNOSIS — M4184 Other forms of scoliosis, thoracic region: Secondary | ICD-10-CM | POA: Diagnosis not present

## 2019-10-05 ENCOUNTER — Ambulatory Visit: Payer: Self-pay | Admitting: Cardiology

## 2019-10-07 ENCOUNTER — Encounter: Payer: Self-pay | Admitting: Cardiology

## 2019-10-07 ENCOUNTER — Other Ambulatory Visit: Payer: Self-pay

## 2019-10-07 ENCOUNTER — Ambulatory Visit (INDEPENDENT_AMBULATORY_CARE_PROVIDER_SITE_OTHER): Payer: BC Managed Care – PPO | Admitting: Cardiology

## 2019-10-07 VITALS — BP 130/88 | HR 56 | Temp 96.6°F | Ht 68.0 in | Wt 172.3 lb

## 2019-10-07 DIAGNOSIS — E7800 Pure hypercholesterolemia, unspecified: Secondary | ICD-10-CM | POA: Diagnosis not present

## 2019-10-07 DIAGNOSIS — Z8249 Family history of ischemic heart disease and other diseases of the circulatory system: Secondary | ICD-10-CM | POA: Diagnosis not present

## 2019-10-07 DIAGNOSIS — R0789 Other chest pain: Secondary | ICD-10-CM

## 2019-10-07 NOTE — Progress Notes (Signed)
Primary Physician/Referring:  Crist Infante, MD  Patient ID: Jimmy Edwards, male    DOB: [birth year], 59 y.o.   MRN: [id number]  Chief Complaint  Patient presents with  . Chest Pain  . New Patient (Initial Visit)   HPI:    Jimmy Edwards  is a 59 y.o. with hyperlipidemia, anxiety, tubular adenoma in 2015, referred to [place] by Dr. Joylene Draft for evaluation of chest pain.   1 month ago he started having pain under his left shoulder blade area, he now he is having sharp chest pain. No pain radiation or shortness of breath. States felt like pinpoint. Mylanta seemed to help some. Chest pain was intermittent but unrelated to exertion or resting. He has not had any chest pain for the last 1 week. He has resumed all normal activities. He does have history of hiatal hernia.   No former tobacco use. Rare alcohol use. He does exercise 2-3 days a week with weight lifting. He is also pretty active with his jobs.   Maternal grandfather had MI at age 35. Father had CABG at age 34 approximately.   Past Medical History:  Diagnosis Date  . Anxiety   . Depression    takes lexapro  . GERD (gastroesophageal reflux disease)   . Hemorrhoids   . History of hiatal hernia   . Hyperlipidemia   . Left ureteral calculus   . Thrombophlebitis arm    from IV following colon surgery  . Tubular adenoma of colon    Past Surgical History:  Procedure Laterality Date  . APPENDECTOMY    . COLON RESECTION N/A 02/01/2015   Procedure: LAPAROSCOPIC partial cecectomy with appendectomy;  Surgeon: Jackolyn Confer, MD;  Location: [facility];  Service: General;  Laterality: N/A;  . colonscopy     . EXTRACORPOREAL SHOCK WAVE LITHOTRIPSY Left 02-09-2013  . ROOT CANAL    . UPPER GI ENDOSCOPY     Social History   Tobacco Use  . Smoking status: Never Smoker  . Smokeless tobacco: Never Used  Substance Use Topics  . Alcohol use: Yes    Alcohol/week: 0.0 standard drinks    Comment: Rarely    ROS  Review of Systems  Constitution:  Negative for decreased appetite, malaise/fatigue, weight gain and weight loss.  Eyes: Negative for visual disturbance.  Cardiovascular: Positive for chest pain. Negative for claudication, dyspnea on exertion, leg swelling, orthopnea, palpitations and syncope.  Respiratory: Negative for hemoptysis and wheezing.   Endocrine: Negative for cold intolerance and heat intolerance.  Hematologic/Lymphatic: Does not bruise/bleed easily.  Skin: Negative for nail changes.  Musculoskeletal: Negative for muscle weakness and myalgias.  Gastrointestinal: Negative for abdominal pain, change in bowel habit, nausea and vomiting.  Neurological: Negative for difficulty with concentration, dizziness, focal weakness and headaches.  Psychiatric/Behavioral: Negative for altered mental status and suicidal ideas.  All other systems reviewed and are negative.  Objective  Blood pressure 130/88, pulse (!) 56, temperature (!) 96.6 F (35.9 C), height 5' 8"  (1.727 m), weight 172 lb 4.8 oz (78.2 kg), SpO2 99 %.  Vitals with BMI 10/07/2019 02/20/2018 12/10/2017  Height 5' 8"  5' 8"  5' 8"   Weight 172 lbs 5 oz 167 lbs 164 lbs 10 oz  BMI 26.2 84.6 65.99  Systolic 357 017 -  Diastolic 88 90 -  Pulse 56 75 -    Physical Exam  Constitutional: He is oriented to person, place, and time. Vital signs are normal. He appears well-developed and well-nourished.  HENT:  Head: Normocephalic and atraumatic.  Cardiovascular: Normal rate, regular rhythm, normal heart sounds and intact distal pulses.  Pulmonary/Chest: Effort normal and breath sounds normal. No accessory muscle usage. No respiratory distress.  Abdominal: Soft. Bowel sounds are normal.  Musculoskeletal:        General: Normal range of motion.     Cervical back: Normal range of motion.  Neurological: He is alert and oriented to person, place, and time.  Skin: Skin is warm and dry.  Vitals reviewed.  Laboratory examination:   No results for input(s): NA, K, CL, CO2,  GLUCOSE, BUN, CREATININE, CALCIUM, GFRNONAA, GFRAA in the last 8760 hours. CrCl cannot be calculated (Patient's most recent lab result is older than the maximum 21 days allowed.).  CMP Latest Ref Rng & Units 01/16/2017 02/18/2015 02/17/2015  Glucose 65 - 99 mg/dL - 98 101(H)  BUN 6 - 20 mg/dL - 19 19  Creatinine 0.61 - 1.24 mg/dL - 1.00 0.98  Sodium 135 - 145 mmol/L - 139 138  Potassium 3.5 - 5.1 mmol/L - 4.3 3.8  Chloride 101 - 111 mmol/L - 101 103  CO2 22 - 32 mmol/L - 29 26  Calcium 8.9 - 10.3 mg/dL - 8.9 9.6  Total Protein 6.1 - 8.1 g/dL 6.7 - 7.8  Total Bilirubin 0.2 - 1.2 mg/dL 0.4 - 0.6  Alkaline Phos 40 - 115 U/L 69 - 83  AST 10 - 35 U/L 31 - 22  ALT 9 - 46 U/L 21 - 21   CBC Latest Ref Rng & Units 01/16/2017 02/18/2015 02/17/2015  WBC 3.8 - 10.8 K/uL 6.7 9.3 11.5(H)  Hemoglobin 13.2 - 17.1 g/dL 15.4 13.6 15.3  Hematocrit 38.5 - 50.0 % 44.9 39.4 44.2  Platelets 140 - 400 K/uL 218 204 213   Lipid Panel  No results found for: CHOL, TRIG, HDL, CHOLHDL, VLDL, LDLCALC, LDLDIRECT HEMOGLOBIN A1C No results found for: HGBA1C, MPG TSH No results for input(s): TSH in the last 8760 hours.  External labs 03/11/2019: Cholesterol 154, Triglyerides 75, HDL 56, LDL 83. TSH normal. Creatinine 1.0, eGFR 76.5, potassium 4.5, normal CMP.     Medications and allergies   Allergies  Allergen Reactions  . Clindamycin Hcl Other (See Comments)    Chest pressure and tightness Other reaction(s): Other (See Comments) Burning in throat/difficulty swallowing  . Tetracyclines & Related Other (See Comments)    Chest pressure and tightness  . Tetracycline     Other reaction(s): Other (See Comments) Burning in throat, difficulty swallowing     Current Outpatient Medications  Medication Instructions  . diazepam (VALIUM) 5 mg, Oral, Daily PRN, Reported on 11/25/2015  . escitalopram (LEXAPRO) 20 mg, Oral, Daily  . ezetimibe (ZETIA) 10 mg, Oral, Daily  . Multiple Vitamin (MULTIVITAMIN WITH MINERALS)  TABS 1 tablet, Daily  . omeprazole (PRILOSEC) 20 MG capsule TAKE ONE CAPSULE BY MOUTH DAILY( DISCONTINUE PRESCRIPTION FOR 40 MG DOSING)  . simvastatin (ZOCOR) 20 mg, Oral, Daily    Radiology:  No results found.  Cardiac Studies:   none  Assessment     ICD-10-CM   1. Atypical chest pain  R07.89 EKG 12-Lead    PCV ECHOCARDIOGRAM COMPLETE    CT CARDIAC SCORING  2. Family history of heart disease  Z82.49   3. Pure hypercholesterolemia  E78.00     EKG 10/07/2019: Sinus bradycardia at 53 bpm, normal axis, no evidence of ischemia.   No orders of the defined types were placed in this encounter.   There are no discontinued medications.   Recommendations:  Patient is referred to [place] for evaluation of chest pain by Dr. Joylene Draft. His symptoms of chest pain are suggestive of atypical chest pain. Not associated with exertion. He has resumed all normal activities and has not had any recurrence of chest pain. EKG and physical exam are unremarkable. He does have risk factors for CAD with hyperlipidemia and family history of CAD. I have recommended that he have coronary calcium score CT for further evaluation and risk assessment. If his calcium score is elevated, will consider stress testing at that time.Will obtain echocardiogram to exclude any structural abnormalities given his chest pain. Lipids are well controlled with current medications, will continue with this for now. No history of hypertension. I have advised him that he may continue with his normal, routine activities, but to avoid any strenuous activities until he can have further workup. I will see him back after the test for follow up.    *I have discussed this case with Dr. Einar Gip and he personally examined the patient and participated in formulating the plan.*   Miquel Dunn, MSN, APRN, FNP-C [facility]. [place] Office: [phone] Fax: [phone]

## 2019-10-14 ENCOUNTER — Other Ambulatory Visit: Payer: Self-pay

## 2019-10-14 ENCOUNTER — Ambulatory Visit: Payer: BC Managed Care – PPO

## 2019-10-14 DIAGNOSIS — R0789 Other chest pain: Secondary | ICD-10-CM

## 2019-10-27 ENCOUNTER — Other Ambulatory Visit: Payer: Self-pay

## 2019-10-27 ENCOUNTER — Ambulatory Visit: Payer: BC Managed Care – PPO | Admitting: Cardiology

## 2019-10-27 ENCOUNTER — Encounter: Payer: Self-pay | Admitting: Cardiology

## 2019-10-27 VITALS — BP 133/90 | HR 65 | Temp 94.1°F | Ht 68.0 in | Wt 172.0 lb

## 2019-10-27 DIAGNOSIS — I712 Thoracic aortic aneurysm, without rupture: Secondary | ICD-10-CM

## 2019-10-27 DIAGNOSIS — I7121 Aneurysm of the ascending aorta, without rupture: Secondary | ICD-10-CM

## 2019-10-27 DIAGNOSIS — R931 Abnormal findings on diagnostic imaging of heart and coronary circulation: Secondary | ICD-10-CM

## 2019-10-27 DIAGNOSIS — K219 Gastro-esophageal reflux disease without esophagitis: Secondary | ICD-10-CM

## 2019-10-27 DIAGNOSIS — Z8249 Family history of ischemic heart disease and other diseases of the circulatory system: Secondary | ICD-10-CM | POA: Diagnosis not present

## 2019-10-27 DIAGNOSIS — E785 Hyperlipidemia, unspecified: Secondary | ICD-10-CM | POA: Diagnosis not present

## 2019-10-27 MED ORDER — ASPIRIN EC 81 MG PO TBEC: 81.0000 mg | DELAYED_RELEASE_TABLET | Freq: Every day | ORAL | 3 refills | Status: DC

## 2019-10-27 MED ORDER — PANTOPRAZOLE SODIUM 40 MG PO TBEC: 40.0000 mg | DELAYED_RELEASE_TABLET | Freq: Every day | ORAL | 11 refills | Status: DC

## 2019-10-27 MED ORDER — ROSUVASTATIN CALCIUM 20 MG PO TABS: 20.0000 mg | ORAL_TABLET | Freq: Every day | ORAL | 1 refills | Status: DC

## 2019-10-27 MED ORDER — LOSARTAN POTASSIUM 25 MG PO TABS: 25.0000 mg | ORAL_TABLET | Freq: Every day | ORAL | 1 refills | Status: DC

## 2019-10-27 NOTE — Progress Notes (Signed)
Primary Physician/Referring:  Crist Infante, MD  Patient ID: Jimmy Edwards, male    DOB: May 29, 1961, 59 y.o.   MRN: [id number]  Chief Complaint  Patient presents with  . Chest Pain    follow up after tests  . Follow-up   HPI:    Jimmy Edwards  is a 59 y.o. with hyperlipidemia, anxiety, tubular adenoma in 2015, recently evaluated by [place] for atypical chest pain. He underwent calcium score CT and echocardiogram and now presents for follow up.   He has not had any further episodes of chest pain.  Overall feeling well.  He does mention he was started on omeprazole for GERD, has had improvement and acid reflux, but continues to have frequent belching.  He does have history of hiatal hernia.   No former tobacco use. Rare alcohol use. He does exercise 2-3 days a week with weight lifting. He is also pretty active with his jobs.   Maternal grandfather had MI at age 75. Father had CABG at age 85 approximately.   Past Medical History:  Diagnosis Date  . Anxiety   . Depression    takes lexapro  . GERD (gastroesophageal reflux disease)   . Hemorrhoids   . History of hiatal hernia   . Hyperlipidemia   . Left ureteral calculus   . Thrombophlebitis arm    from IV following colon surgery  . Tubular adenoma of colon    Past Surgical History:  Procedure Laterality Date  . APPENDECTOMY    . COLON RESECTION N/A 02/01/2015   Procedure: LAPAROSCOPIC partial cecectomy with appendectomy;  Surgeon: Jackolyn Confer, MD;  Location: [facility];  Service: General;  Laterality: N/A;  . colonscopy     . EXTRACORPOREAL SHOCK WAVE LITHOTRIPSY Left 02-09-2013  . ROOT CANAL    . UPPER GI ENDOSCOPY     Social History   Tobacco Use  . Smoking status: Never Smoker  . Smokeless tobacco: Never Used  Substance Use Topics  . Alcohol use: Yes    Alcohol/week: 0.0 standard drinks    Comment: Rarely    ROS  Review of Systems  Constitution: Negative for decreased appetite, malaise/fatigue, weight gain and weight  loss.  Eyes: Negative for visual disturbance.  Cardiovascular: Positive for chest pain. Negative for claudication, dyspnea on exertion, leg swelling, orthopnea, palpitations and syncope.  Respiratory: Negative for hemoptysis and wheezing.   Endocrine: Negative for cold intolerance and heat intolerance.  Hematologic/Lymphatic: Does not bruise/bleed easily.  Skin: Negative for nail changes.  Musculoskeletal: Negative for muscle weakness and myalgias.  Gastrointestinal: Negative for abdominal pain, change in bowel habit, nausea and vomiting.  Neurological: Negative for difficulty with concentration, dizziness, focal weakness and headaches.  Psychiatric/Behavioral: Negative for altered mental status and suicidal ideas.  All other systems reviewed and are negative.  Objective  Blood pressure 133/90, pulse 65, temperature (!) 94.1 F (34.5 C), height _0  (1.727 m), weight 172 lb (78 kg), SpO2 98 %.  Vitals with BMI 10/27/2019 10/07/2019 02/20/2018  Height _1  _2  _3   Weight 172 lbs 172 lbs 5 oz 167 lbs  BMI 26.16 57.0 17.7  Systolic 939 030 092  Diastolic 90 88 90  Pulse 65 56 75    Physical Exam  Constitutional: He is oriented to person, place, and time. Vital signs are normal. He appears well-developed and well-nourished.  HENT:  Head: Normocephalic and atraumatic.  Cardiovascular: Normal rate, regular rhythm, normal heart sounds and intact distal pulses.  Pulmonary/Chest: Effort normal and  breath sounds normal. No accessory muscle usage. No respiratory distress.  Abdominal: Soft. Bowel sounds are normal.  Musculoskeletal:        General: Normal range of motion.     Cervical back: Normal range of motion.  Neurological: He is alert and oriented to person, place, and time.  Skin: Skin is warm and dry.  Vitals reviewed.  Laboratory examination:   No results for input(s): NA, K, CL, CO2, GLUCOSE, BUN, CREATININE, CALCIUM, GFRNONAA, GFRAA in the last 8760 hours. CrCl cannot be  calculated (Patient's most recent lab result is older than the maximum 21 days allowed.).  CMP Latest Ref Rng & Units 01/16/2017 02/18/2015 02/17/2015  Glucose 65 - 99 mg/dL - 98 101(H)  BUN 6 - 20 mg/dL - 19 19  Creatinine 0.61 - 1.24 mg/dL - 1.00 0.98  Sodium 135 - 145 mmol/L - 139 138  Potassium 3.5 - 5.1 mmol/L - 4.3 3.8  Chloride 101 - 111 mmol/L - 101 103  CO2 22 - 32 mmol/L - 29 26  Calcium 8.9 - 10.3 mg/dL - 8.9 9.6  Total Protein 6.1 - 8.1 g/dL 6.7 - 7.8  Total Bilirubin 0.2 - 1.2 mg/dL 0.4 - 0.6  Alkaline Phos 40 - 115 U/L 69 - 83  AST 10 - 35 U/L 31 - 22  ALT 9 - 46 U/L 21 - 21   CBC Latest Ref Rng & Units 01/16/2017 02/18/2015 02/17/2015  WBC 3.8 - 10.8 K/uL 6.7 9.3 11.5(H)  Hemoglobin 13.2 - 17.1 g/dL 15.4 13.6 15.3  Hematocrit 38.5 - 50.0 % 44.9 39.4 44.2  Platelets 140 - 400 K/uL 218 204 213   Lipid Panel  No results found for: CHOL, TRIG, HDL, CHOLHDL, VLDL, LDLCALC, LDLDIRECT HEMOGLOBIN A1C No results found for: HGBA1C, MPG TSH No results for input(s): TSH in the last 8760 hours.  External labs 03/11/2019: Cholesterol 154, Triglyerides 75, HDL 56, LDL 83. TSH normal. Creatinine 1.0, eGFR 76.5, potassium 4.5, normal CMP.     Medications and allergies   Allergies  Allergen Reactions  . Clindamycin Hcl Other (See Comments)    Chest pressure and tightness Other reaction(s): Other (See Comments) Burning in throat/difficulty swallowing  . Tetracyclines & Related Other (See Comments)    Chest pressure and tightness  . Tetracycline     Other reaction(s): Other (See Comments) Burning in throat, difficulty swallowing     Current Outpatient Medications  Medication Instructions  . aspirin EC 81 mg, Oral, Daily  . diazepam (VALIUM) 5 mg, Oral, Daily PRN, Reported on 11/25/2015  . escitalopram (LEXAPRO) 20 mg, Oral, Daily  . ezetimibe (ZETIA) 10 mg, Oral, Daily  . losartan (COZAAR) 25 mg, Oral, Daily  . Multiple Vitamin (MULTIVITAMIN WITH MINERALS) TABS 1 tablet,  Daily  . omeprazole (PRILOSEC) 20 MG capsule TAKE ONE CAPSULE BY MOUTH DAILY( DISCONTINUE PRESCRIPTION FOR 40 MG DOSING)  . rosuvastatin (CRESTOR) 20 mg, Oral, Daily    Radiology:  Coronary calcium score CT 10/09/2019:  1. Total calcium score of 66 is between the 25th and 50th percentile for males between the ages of 10 and 26. 2. This is compatible with definite, at least mild atherosclerotic plaque. Risk of coronary artery disease: Mild or minimal coronary narrowings likely. 3. Ascending thoracic aortic aneurysm measuring up to 4.2 cm.  Cardiac Studies:   Echocardiogram 10/14/2019:  Normal LV systolic function with visual EF 55-60%. Left ventricle cavity  is normal in size. Normal left ventricular wall thickness. Normal global  wall motion. Normal  diastolic filling pattern. No obvious regional wall  motion abnormalities. Calculated EF 59%.  Mild (Grade I) aortic regurgitation.  Mild (Grade I) mitral regurgitation.  Mild tricuspid regurgitation. No evidence of pulmonary hypertension.  The aortic root at the level of Sinus of Valsalva is dilated (4.1cm).  Proximal ascending aorta is not visualized.  IVC is normal with blunted respiratory response.  No prior study for comparison.   Assessment     ICD-10-CM   1. Agatston CAC score, <100  R93.1 PCV MYOCARDIAL PERFUSION WITH LEXISCAN  2. Ascending aortic aneurysm (HCC)  H63.1 Basic metabolic panel    Basic metabolic panel  3. Hyperlipidemia LDL goal <70  S97.0 Basic metabolic panel    Basic metabolic panel  4. Family history of heart disease  Z82.49 PCV MYOCARDIAL PERFUSION WITH LEXISCAN    EKG 10/07/2019: Sinus bradycardia at 53 bpm, normal axis, no evidence of ischemia.   Meds ordered this encounter  Medications  . rosuvastatin (CRESTOR) 20 MG tablet    Sig: Take 1 tablet (20 mg total) by mouth daily.    Dispense:  30 tablet    Refill:  1    Order Specific Question:   Supervising Provider    Answer:   Adrian Prows [[id number]]    . losartan (COZAAR) 25 MG tablet    Sig: Take 1 tablet (25 mg total) by mouth daily.    Dispense:  30 tablet    Refill:  1    Order Specific Question:   Supervising Provider    Answer:   Adrian Prows [[id number]]  . aspirin EC 81 MG tablet    Sig: Take 1 tablet (81 mg total) by mouth daily.    Dispense:  90 tablet    Refill:  3    Order Specific Question:   Supervising Provider    Answer:   Adrian Prows [[id number]]    Medications Discontinued During This Encounter  Medication Reason  . simvastatin (ZOCOR) 20 MG tablet Discontinued by provider     Recommendations:   Karanveer Ramakrishnan  is a 59 y.o. with hyperlipidemia, anxiety, tubular adenoma in 2015, recently evaluated by [place] for atypical chest pain. He underwent calcium score CT 10/09/2019 that showed Agaston score of 66.  Noted to have ascending thoracic aortic aneurysm measuring 4.2 cm.  Echocardiogram performed on 10/14/19 showed normal LVEF, mild aortic and mitral regurgitation, and mild aortic root dilation at 4.1 cm.  He now presents to discuss results.  I have reviewed and discussed his calcium score CT and echocardiogram findings with the patient. He has not had any recurrence of atypical chest pain and resumed all normal activities. Although his Agaston score was fairly low, in view of his risk factors including family history of heart disease and hyperlipidemia, will further evaluate with exercise nuclear stress testing for further functional evaluation. He will continue to need aggressive risk factor modification. I will change Simvastatin to Crestor 20 mg daily. Continue with Zetia. Will need repeat lipids in 8 weeks, will obtain after his next office visit. I have encouraged him to start daily 81 mg ASA.   He does have mild ascending aortic aneurysm measuring 4.2 cm on CT scan and 4.1 cm on echocardiogram. Will need continued surveillance in 6 months. Will arrange this at his next office visit. He does have some occasional fluctuations in BP and in  view on aortic aneurysm, will start Losartan 25 mg daily. Will check BMP in 2 weeks for follow up on kidney function. Continue  with dietary changes and regular exercise.  He does mention some issues with GERD and frequent belching. Has history of hiatal hernia. Will change Omeprazole to Protonix to see if he has any improvement in symptoms. Will see him back in 6 weeks for follow up after stress testing to discuss results and follow up on hypertension.     Miquel Dunn, MSN, APRN, FNP-C [facility]. [place] Office: [phone] Fax: [phone]

## 2019-11-13 ENCOUNTER — Ambulatory Visit: Payer: Self-pay | Attending: Internal Medicine

## 2019-11-13 DIAGNOSIS — Z23 Encounter for immunization: Secondary | ICD-10-CM

## 2019-11-17 DIAGNOSIS — I712 Thoracic aortic aneurysm, without rupture: Secondary | ICD-10-CM | POA: Diagnosis not present

## 2019-11-17 DIAGNOSIS — E785 Hyperlipidemia, unspecified: Secondary | ICD-10-CM | POA: Diagnosis not present

## 2019-11-18 LAB — BASIC METABOLIC PANEL
BUN/Creatinine Ratio: 21 (ref 10–24)
BUN: 21 mg/dL (ref 8–27)
CO2: 26 mmol/L (ref 20–29)
Calcium: 9.9 mg/dL (ref 8.6–10.2)
Chloride: 101 mmol/L (ref 96–106)
Creatinine, Ser: 1.01 mg/dL (ref 0.76–1.27)
GFR calc Af Amer: 93 mL/min/{1.73_m2} (ref >59)
GFR calc non Af Amer: 80 mL/min/{1.73_m2} (ref >59)
Glucose: 75 mg/dL (ref 65–99)
Potassium: 4.7 mmol/L (ref 3.5–5.2)
Sodium: 141 mmol/L (ref 134–144)

## 2019-12-01 DIAGNOSIS — H5213 Myopia, bilateral: Secondary | ICD-10-CM | POA: Diagnosis not present

## 2019-12-01 DIAGNOSIS — H43813 Vitreous degeneration, bilateral: Secondary | ICD-10-CM | POA: Diagnosis not present

## 2019-12-01 DIAGNOSIS — H18591 Other hereditary corneal dystrophies, right eye: Secondary | ICD-10-CM | POA: Diagnosis not present

## 2019-12-09 ENCOUNTER — Ambulatory Visit: Payer: Self-pay | Attending: Internal Medicine

## 2019-12-09 DIAGNOSIS — Z23 Encounter for immunization: Secondary | ICD-10-CM

## 2019-12-09 NOTE — Progress Notes (Signed)
   Covid-19 Vaccination Clinic  Name:  Zao Masood    MRN: [id number] DOB: 08-Aug-1961  12/09/2019  Mr. Poole was observed post Covid-19 immunization for 15 minutes without incident. He was provided with Vaccine Information Sheet and instruction to access the V-Safe system.   Mr. Kudrick was instructed to call 911 with any severe reactions post vaccine: Marland Kitchen Difficulty breathing  . Swelling of face and throat  . A fast heartbeat  . A bad rash all over body  . Dizziness and weakness   Immunizations Administered    Name Date Dose VIS Date Route   Pfizer COVID-19 Vaccine 12/09/2019  9:16 AM 0.3 mL 08/07/2019 Intramuscular   Manufacturer: [place]   [place]ot: [id number]   [place]: [id number]

## 2019-12-10 ENCOUNTER — Encounter: Payer: Self-pay | Admitting: Cardiology

## 2019-12-10 ENCOUNTER — Ambulatory Visit: Payer: BC Managed Care – PPO | Admitting: Cardiology

## 2019-12-10 ENCOUNTER — Other Ambulatory Visit: Payer: Self-pay

## 2019-12-10 VITALS — BP 124/84 | HR 71 | Temp 97.2°F | Resp 16 | Ht 68.0 in | Wt 174.1 lb

## 2019-12-10 DIAGNOSIS — R931 Abnormal findings on diagnostic imaging of heart and coronary circulation: Secondary | ICD-10-CM | POA: Diagnosis not present

## 2019-12-10 DIAGNOSIS — I712 Thoracic aortic aneurysm, without rupture: Secondary | ICD-10-CM

## 2019-12-10 DIAGNOSIS — E785 Hyperlipidemia, unspecified: Secondary | ICD-10-CM

## 2019-12-10 DIAGNOSIS — I10 Essential (primary) hypertension: Secondary | ICD-10-CM

## 2019-12-10 DIAGNOSIS — I7121 Aneurysm of the ascending aorta, without rupture: Secondary | ICD-10-CM

## 2019-12-10 MED ORDER — LOSARTAN POTASSIUM 50 MG PO TABS: 50.0000 mg | ORAL_TABLET | Freq: Every day | ORAL | 3 refills | Status: AC

## 2019-12-10 NOTE — Progress Notes (Signed)
Primary Physician/Referring:  Crist Infante, MD  Patient ID: Jimmy Edwards, male    DOB: Oct 29, 1960, 59 y.o.   MRN: [id number]  Chief Complaint  Patient presents with  . Hypertension  . Results    Labs  . Follow-up    6 week   HPI:    Jimmy Edwards  is a 59 y.o. with hyperlipidemia, anxiety, tubular adenoma in 2015, hypertension, hyperlipidemia, aortic root dilatation, coronary calcium score of 30, family history of CAD with maternal grandfather had MI at age 76. Father had CABG at age 17 approximately.   He was evaluated for chest pain and since being on PPI has not had any recurrence. He continues to exercise regularly and remains otherwise asymptomatic. He is wondering if he could cancel stress testing, presents for a 3 month f/u.  Patient started on Losartan fo relevated BP and also aortic root dilatation which he is tolerating.   Past Medical History:  Diagnosis Date  . Anxiety   . Depression    takes lexapro  . GERD (gastroesophageal reflux disease)   . Hemorrhoids   . History of hiatal hernia   . Hyperlipidemia   . Left ureteral calculus   . Thrombophlebitis arm    from IV following colon surgery  . Tubular adenoma of colon    Past Surgical History:  Procedure Laterality Date  . APPENDECTOMY    . COLON RESECTION N/A 02/01/2015   Procedure: LAPAROSCOPIC partial cecectomy with appendectomy;  Surgeon: Jackolyn Confer, MD;  Location: [facility];  Service: General;  Laterality: N/A;  . colonscopy     . EXTRACORPOREAL SHOCK WAVE LITHOTRIPSY Left 02-09-2013  . ROOT CANAL    . UPPER GI ENDOSCOPY     Social History   Tobacco Use  . Smoking status: Never Smoker  . Smokeless tobacco: Never Used  Substance Use Topics  . Alcohol use: Yes    Alcohol/week: 0.0 standard drinks    Comment: Rarely    ROS  Review of Systems  Cardiovascular: Negative for chest pain, dyspnea on exertion and leg swelling.  Gastrointestinal: Negative for melena.   Objective  Blood pressure  124/84, pulse 71, temperature (!) 97.2 F (36.2 C), temperature source Temporal, resp. rate 16, height 5' 8"  (1.727 m), weight 174 lb 1.6 oz (79 kg), SpO2 98 %.  Vitals with BMI 12/10/2019 10/27/2019 10/07/2019  Height 5' 8"  5' 8"  5' 8"   Weight 174 lbs 2 oz 172 lbs 172 lbs 5 oz  BMI 26.48 95.63 87.5  Systolic 643 329 518  Diastolic 84 90 88  Pulse 71 65 56    Physical Exam  Constitutional: He appears well-developed and well-nourished.  Cardiovascular: Normal rate, regular rhythm, normal heart sounds and intact distal pulses. Exam reveals no gallop.  No murmur heard. No leg edema, no JVD.  Pulmonary/Chest: Effort normal and breath sounds normal.  Abdominal: Soft. Bowel sounds are normal.   Laboratory examination:   Recent Labs    11/17/19 1329  NA 141  K 4.7  CL 101  CO2 26  GLUCOSE 75  BUN 21  CREATININE 1.01  CALCIUM 9.9  GFRNONAA 80  GFRAA 93   CrCl cannot be calculated (Patient's most recent lab result is older than the maximum 21 days allowed.).  CMP Latest Ref Rng & Units 11/17/2019 01/16/2017 02/18/2015  Glucose 65 - 99 mg/dL 75 - 98  BUN 8 - 27 mg/dL 21 - 19  Creatinine 0.76 - 1.27 mg/dL 1.01 - 1.00  Sodium 134 -  144 mmol/L 141 - 139  Potassium 3.5 - 5.2 mmol/L 4.7 - 4.3  Chloride 96 - 106 mmol/L 101 - 101  CO2 20 - 29 mmol/L 26 - 29  Calcium 8.6 - 10.2 mg/dL 9.9 - 8.9  Total Protein 6.1 - 8.1 g/dL - 6.7 -  Total Bilirubin 0.2 - 1.2 mg/dL - 0.4 -  Alkaline Phos 40 - 115 U/L - 69 -  AST 10 - 35 U/L - 31 -  ALT 9 - 46 U/L - 21 -   CBC Latest Ref Rng & Units 01/16/2017 02/18/2015 02/17/2015  WBC 3.8 - 10.8 K/uL 6.7 9.3 11.5(H)  Hemoglobin 13.2 - 17.1 g/dL 15.4 13.6 15.3  Hematocrit 38.5 - 50.0 % 44.9 39.4 44.2  Platelets 140 - 400 K/uL 218 204 213   Lipid Panel  No results found for: CHOL, TRIG, HDL, CHOLHDL, VLDL, LDLCALC, LDLDIRECT HEMOGLOBIN A1C No results found for: HGBA1C, MPG TSH No results for input(s): TSH in the last 8760 hours.  External labs  03/11/2019: Cholesterol 154, Triglyerides 75, HDL 56, LDL 83. TSH normal. Creatinine 1.0, eGFR 76.5, potassium 4.5, normal CMP.   Medications and allergies   Allergies  Allergen Reactions  . Clindamycin Hcl Other (See Comments)    Chest pressure and tightness Other reaction(s): Other (See Comments) Burning in throat/difficulty swallowing  . Tetracyclines & Related Other (See Comments)    Chest pressure and tightness  . Tetracycline     Other reaction(s): Other (See Comments) Burning in throat, difficulty swallowing     Current Outpatient Medications  Medication Instructions  . escitalopram (LEXAPRO) 20 mg, Oral, Daily  . ezetimibe (ZETIA) 10 mg, Oral, Daily  . losartan (COZAAR) 50 mg, Oral, Daily after supper  . Multiple Vitamin (MULTIVITAMIN WITH MINERALS) TABS 1 tablet, Daily  . omeprazole (PRILOSEC) 20 mg, Oral, Daily  . rosuvastatin (CRESTOR) 20 mg, Oral, Daily   Radiology:  Coronary calcium score CT 10/09/2019:  1. Total calcium score of 66 is between the 25th and 50th percentile for males between the ages of 79 and 26. 2. This is compatible with definite, at least mild atherosclerotic plaque. Risk of coronary artery disease: Mild or minimal coronary narrowings likely. 3. Ascending thoracic aortic aneurysm measuring up to 4.2 cm.  Cardiac Studies:   Echocardiogram 10/14/2019:  Normal LV systolic function with visual EF 55-60%. Left ventricle cavity  is normal in size. Normal left ventricular wall thickness. Normal global  wall motion. Normal diastolic filling pattern. No obvious regional wall  motion abnormalities. Calculated EF 59%.  Mild (Grade I) aortic regurgitation.  Mild (Grade I) mitral regurgitation.  Mild tricuspid regurgitation. No evidence of pulmonary hypertension.  The aortic root at the level of Sinus of Valsalva is dilated (4.1cm).  Proximal ascending aorta is not visualized.  IVC is normal with blunted respiratory response.  No prior study for  comparison.   Assessment     ICD-10-CM   1. Agatston CAC score, <100  R93.1   2. Ascending aortic aneurysm (HCC)  I71.2 losartan (COZAAR) 50 MG tablet    PCV ECHOCARDIOGRAM COMPLETE  3. Hyperlipidemia LDL goal <70  E78.5   4. Primary hypertension  I10 losartan (COZAAR) 50 MG tablet    PCV ECHOCARDIOGRAM COMPLETE    EKG 10/07/2019: Sinus bradycardia at 53 bpm, normal axis, no evidence of ischemia.   Meds ordered this encounter  Medications  . losartan (COZAAR) 50 MG tablet    Sig: Take 1 tablet (50 mg total) by mouth daily after  supper.    Dispense:  90 tablet    Refill:  3    Medications Discontinued During This Encounter  Medication Reason  . aspirin EC 81 MG tablet Patient Preference  . diazepam (VALIUM) 5 MG tablet No longer needed (for PRN medications)  . pantoprazole (PROTONIX) 40 MG tablet Change in therapy  . losartan (COZAAR) 25 MG tablet Reorder     Recommendations:   Lathan Gieselman  is a 59 y.o. with hyperlipidemia, anxiety, tubular adenoma in 2015, recently evaluated by [place] for atypical chest pain. He underwent calcium score CT 10/09/2019 that showed Agaston score of 66.  Noted to have ascending thoracic aortic aneurysm measuring 4.2 cm.  Echocardiogram performed on 10/14/19 showed normal LVEF, mild aortic and mitral regurgitation, and mild aortic root dilation at 4.1 cm.    He is fairly active, exercise regularly without any chest pain, symptoms of chest pain are clearly related to GERD, symptoms are completely resolved since being on PPI.  Do not suspect significant CAD, he does have coronary calcification and needs primary prevention.  He is now able to tolerate aspirin.  He is now on high intensity high-dose statin, needs lipid profile testing he prefers to have it done with his PCP in July 2021.  Blood pressure is still elevated, will increase losartan to 50 mg in the evening, BMP has been stable with 25 mg.  This will also help for aortic root dilatation.  I will see  him back in 1 year unless he has recurrence of exertional chest pain, we will repeat echocardiogram prior to his next office visit to follow-up on aortic regurgitation.  Otherwise remains asymptomatic, no significant change in physical exam.  Adrian Prows, MD, [facility] 12/10/2019, 10:56 AM [facility]. [place] Office: [phone]

## 2019-12-18 ENCOUNTER — Other Ambulatory Visit: Payer: Self-pay | Admitting: Cardiology

## 2019-12-30 ENCOUNTER — Ambulatory Visit (INDEPENDENT_AMBULATORY_CARE_PROVIDER_SITE_OTHER): Payer: BLUE CROSS/BLUE SHIELD | Admitting: Internal Medicine

## 2019-12-30 ENCOUNTER — Encounter: Payer: Self-pay | Admitting: Internal Medicine

## 2019-12-30 VITALS — BP 142/84 | HR 68 | Temp 97.0°F | Ht 68.0 in | Wt 172.2 lb

## 2019-12-30 DIAGNOSIS — K642 Third degree hemorrhoids: Secondary | ICD-10-CM

## 2019-12-30 DIAGNOSIS — K648 Other hemorrhoids: Secondary | ICD-10-CM | POA: Diagnosis not present

## 2019-12-30 DIAGNOSIS — K219 Gastro-esophageal reflux disease without esophagitis: Secondary | ICD-10-CM | POA: Diagnosis not present

## 2019-12-30 DIAGNOSIS — K449 Diaphragmatic hernia without obstruction or gangrene: Secondary | ICD-10-CM | POA: Diagnosis not present

## 2019-12-30 DIAGNOSIS — D126 Benign neoplasm of colon, unspecified: Secondary | ICD-10-CM | POA: Diagnosis not present

## 2019-12-30 MED ORDER — SUTAB 1479-225-188 MG PO TABS: 1.0000 | ORAL_TABLET | ORAL | 0 refills | Status: DC

## 2019-12-30 NOTE — Patient Instructions (Signed)
You have been scheduled for a colonoscopy. Please follow written instructions given to you at your visit today.  Please pick up your prep supplies at the pharmacy within the next 1-3 days. If you use inhalers (even only as needed), please bring them with you on the day of your procedure. Your physician has requested that you go to www.startemmi.com and enter the access code given to you at your visit today. This web site gives a general overview about your procedure. However, you should still follow specific instructions given to you by our office regarding your preparation for the procedure. _______________________________________________ Continue omeprazole 20 mg daily. _______________________________________________ Jimmy Edwards have been scheduled for an appointment with Dr Dema Severin at [facility]. Your appointment is on _____________________ at _____________. Please arrive at _____________________ for registration. Make certain to bring a list of current medications, including any over the counter medications or vitamins. Also bring your co-pay if you have one as well as your insurance cards. [facility] is located at [address]. Should you need to reschedule your appointment, please contact them at [phone]. ________________________________________________ If you are age 18 or older, your body mass index should be between 23-30. Your Body mass index is 26.19 kg/m. If this is out of the aforementioned range listed, please consider follow up with your Primary Care Provider.  If you are age 32 or younger, your body mass index should be between 19-25. Your Body mass index is 26.19 kg/m. If this is out of the aformentioned range listed, please consider follow up with your Primary Care Provider.  __________________________________________________ Due to recent changes in healthcare laws, you may see the results of your imaging and laboratory studies on MyChart  before your provider has had a chance to review them.  We understand that in some cases there may be results that are confusing or concerning to you. Not all laboratory results come back in the same time frame and the provider may be waiting for multiple results in order to interpret others.  Please give [place] 48 hours in order for your provider to thoroughly review all the results before contacting the office for clarification of your results.

## 2019-12-30 NOTE — Progress Notes (Signed)
   Subjective:    Patient ID: Jimmy Edwards, male    DOB: [birth year], 59 y.o.   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  He is here alone today and was last seen for hemorrhoidal banding on 02/20/2018.  He reports that he had a third hemorrhoidal band placed as above in June 2019 but shortly thereafter had a recurrent prolapsed hemorrhoid.  At the time it was more annoying but did recur shortly after banding.  However recently he had an episode of bleeding which happened somewhat spontaneously.  There was seepage of blood through the underwear and even soiling the couch that he was sitting on.  This was embarrassing for him as well.  He has had intermittent bleeding and prolapse continues to be a regular problem.  His bowel habits are very regular 4-5 bowel movements per day.  He eats a high-fiber diet.  No straining or pain with defecation.  There is been no pain when he sees the bleeding.  No abdominal pain.  Is taking omeprazole 20 mg a day which is controlling his reflux well.  His last colonoscopy was performed in December 2017.  This was normal without evidence of residual polyp/adenoma in the cecum.  A large cecal adenoma was found at the appendiceal orifice, he had appendectomy but there was residual polyp which was removed subsequently at colonoscopy.   Review of Systems  As per HPI, otherwise negative  Current Medications, Allergies, Past Medical History, Past Surgical History, Family History and Social History were reviewed in [employer] record.     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   1.  Persistently symptomatic bleeding and prolapsed internal hemorrhoids --he has had persistent hemorrhoidal symptoms despite rubber band therapy x3.  Given ongoing, though intermittent issues with hemorrhoids I recommended rather than additional banding which has not proven durable, that we refer for surgical hemorrhoidectomy.  We discussed this today at length and he is in agreement.  I will refer him to see Dr. Dema Severin at [facility]  2.  History of adenomatous colon polyp --surveillance colonoscopy is recommended at this time given his history of large cecal adenoma and persistent adenoma despite surgical resection.  Fortunately 3 years ago the colonoscopy was without polyps and so if no polyps on this next colonoscopy, then interval can be 5 years. --Colonoscopy to be arranged in the [place] with Su[facility]  3. GERD --he will continue omeprazole 20 mg daily

## 2020-01-01 ENCOUNTER — Telehealth: Payer: Self-pay | Admitting: Internal Medicine

## 2020-01-01 NOTE — Telephone Encounter (Signed)
Spoke with patient to advise that I have contacted the pharmacy and they have ran coupon for Sutab. Rx is now 40 dollars.

## 2020-01-06 ENCOUNTER — Telehealth: Payer: Self-pay | Admitting: *Deleted

## 2020-01-06 NOTE — Telephone Encounter (Signed)
I have spoken to [name], new patient coordinator at [facility] who indicates that she did contact Mr.Jimmy Edwards to schedule a consult to discuss hemorrhoidectomy with Dr Dema Severin in July AFTER his upcoming colonoscopy with Dr Hilarie Fredrickson on 03/11/20. However, patient was not interested in scheduling at this time. He indicated he would call back at a later time.

## 2020-01-15 ENCOUNTER — Other Ambulatory Visit: Payer: Self-pay

## 2020-01-15 MED ORDER — ROSUVASTATIN CALCIUM 20 MG PO TABS: 20.0000 mg | ORAL_TABLET | Freq: Every day | ORAL | 3 refills | Status: AC

## 2020-03-09 ENCOUNTER — Encounter: Payer: Self-pay | Admitting: *Deleted

## 2020-03-11 ENCOUNTER — Other Ambulatory Visit: Payer: Self-pay

## 2020-03-11 ENCOUNTER — Encounter: Payer: Self-pay | Admitting: Internal Medicine

## 2020-03-11 ENCOUNTER — Ambulatory Visit (AMBULATORY_SURGERY_CENTER): Payer: BLUE CROSS/BLUE SHIELD | Admitting: Internal Medicine

## 2020-03-11 VITALS — BP 110/74 | HR 74 | Temp 96.2°F | Resp 12 | Ht 68.0 in | Wt 172.0 lb

## 2020-03-11 DIAGNOSIS — Z1211 Encounter for screening for malignant neoplasm of colon: Secondary | ICD-10-CM | POA: Diagnosis not present

## 2020-03-11 DIAGNOSIS — Z86010 Personal history of colonic polyps: Secondary | ICD-10-CM | POA: Diagnosis not present

## 2020-03-11 DIAGNOSIS — Z860101 Personal history of adenomatous and serrated colon polyps: Secondary | ICD-10-CM

## 2020-03-11 MED ORDER — SODIUM CHLORIDE 0.9 % IV SOLN: 500.0000 mL | Freq: Once | INTRAVENOUS | Status: AC

## 2020-03-11 NOTE — Op Note (Signed)
[facility] Patient Name: Jimmy Edwards Procedure Date: 03/11/2020 9:31 AM MRN: [id number] Endoscopist: Jerene Bears , MD Age: 59 Referring MD:  Date of Birth: 06-23-1961 Gender: Male Account #: [id number] Procedure:                Colonoscopy Indications:              High risk colon cancer surveillance: Personal                            history of adenoma (10 mm or greater in size) May                            2016 requiring extended appendectomy with residual                            polyp found at next surveillance colonoscopy in                            March 2017, Last colonoscopy: December 2017 normal                            without residual polyp Medicines:                Monitored Anesthesia Care Procedure:                Pre-Anesthesia Assessment:                           - Prior to the procedure, a History and Physical                            was performed, and patient medications and                            allergies were reviewed. The patient's tolerance of                            previous anesthesia was also reviewed. The risks                            and benefits of the procedure and the sedation                            options and risks were discussed with the patient.                            All questions were answered, and informed consent                            was obtained. Prior Anticoagulants: The patient has                            taken no previous anticoagulant or antiplatelet  agents. ASA Grade Assessment: II - A patient with                            mild systemic disease. After reviewing the risks                            and benefits, the patient was deemed in                            satisfactory condition to undergo the procedure.                           After obtaining informed consent, the colonoscope                            was passed under direct vision. Throughout the                             procedure, the patient's blood pressure, pulse, and                            oxygen saturations were monitored continuously. The                            Colonoscope was introduced through the anus and                            advanced to the cecum, identified by appendiceal                            orifice and ileocecal valve. The colonoscopy was                            performed without difficulty. The patient tolerated                            the procedure well. The quality of the bowel                            preparation was excellent. The ileocecal valve,                            appendiceal orifice, and rectum were photographed. Scope In: 9:42:37 AM Scope Out: 9:56:40 AM Scope Withdrawal Time: 0 hours 9 minutes 55 seconds  Total Procedure Duration: 0 hours 14 minutes 3 seconds  Findings:                 The digital rectal exam was normal.                           The entire examined colon appeared normal.                           External and internal hemorrhoids were found during  retroflexion and during digital exam. Hemorrhoids                            are small today. There is scarring in the distal                            rectum due to prior hemorrhoidal banding. Complications:            No immediate complications. Estimated Blood Loss:     Estimated blood loss: none. Impression:               - The entire examined colon is normal.                           - Small external and internal hemorrhoids.                           - No specimens collected. Recommendation:           - Patient has a contact number available for                            emergencies. The signs and symptoms of potential                            delayed complications were discussed with the                            patient. Return to normal activities tomorrow.                            Written discharge instructions  were provided to the                            patient.                           - Resume previous diet.                           - Continue present medications.                           - Repeat colonoscopy in 5 years for surveillance. Jerene Bears, MD 03/11/2020 10:00:48 AM This report has been signed electronically.

## 2020-03-11 NOTE — Patient Instructions (Signed)
Handouts given: Hemorrhoids Resume previous diet Continue present medications Repeat colonoscopy in 5 years  YOU HAD AN ENDOSCOPIC PROCEDURE TODAY AT [facility]:   Refer to the procedure report that was given to you for any specific questions about what was found during the examination.  If the procedure report does not answer your questions, please call your gastroenterologist to clarify.  If you requested that your care partner not be given the details of your procedure findings, then the procedure report has been included in a sealed envelope for you to review at your convenience later.  YOU SHOULD EXPECT: Some feelings of bloating in the abdomen. Passage of more gas than usual.  Walking can help get rid of the air that was put into your GI tract during the procedure and reduce the bloating. If you had a lower endoscopy (such as a colonoscopy or flexible sigmoidoscopy) you may notice spotting of blood in your stool or on the toilet paper. If you underwent a bowel prep for your procedure, you may not have a normal bowel movement for a few days.  Please Note:  You might notice some irritation and congestion in your nose or some drainage.  This is from the oxygen used during your procedure.  There is no need for concern and it should clear up in a day or so.  SYMPTOMS TO REPORT IMMEDIATELY:   Following lower endoscopy (colonoscopy or flexible sigmoidoscopy):  Excessive amounts of blood in the stool  Significant tenderness or worsening of abdominal pains  Swelling of the abdomen that is new, acute  Fever of 100F or higher   For urgent or emergent issues, a gastroenterologist can be reached at any hour by calling [phone]. Do not use MyChart messaging for urgent concerns.    DIET:  We do recommend a small meal at first, but then you may proceed to your regular diet.  Drink plenty of fluids but you should avoid alcoholic beverages for 24 hours.  ACTIVITY:  You should  plan to take it easy for the rest of today and you should NOT DRIVE or use heavy machinery until tomorrow (because of the sedation medicines used during the test).    FOLLOW UP: Our staff will call the number listed on your records 48-72 hours following your procedure to check on you and address any questions or concerns that you may have regarding the information given to you following your procedure. If we do not reach you, we will leave a message.  We will attempt to reach you two times.  During this call, we will ask if you have developed any symptoms of COVID 19. If you develop any symptoms (ie: fever, flu-like symptoms, shortness of breath, cough etc.) before then, please call [phone].  If you test positive for Covid 19 in the 2 weeks post procedure, please call and report this information to [place].    If any biopsies were taken you will be contacted by phone or by letter within the next 1-3 weeks.  Please call us at [phone] if you have not heard about the biopsies in 3 weeks.    SIGNATURES/CONFIDENTIALITY: You and/or your care partner have signed paperwork which will be entered into your electronic medical record.  These signatures attest to the fact that that the information above on your After Visit Summary has been reviewed and is understood.  Full responsibility of the confidentiality of this discharge information lies with you and/or your care-partner.

## 2020-03-11 NOTE — Progress Notes (Signed)
A and O x3. Report to RN. Tolerated MAC anesthesia well.

## 2020-03-11 NOTE — Progress Notes (Signed)
Pt's states no medical or surgical changes since previsit or office visit.  VS CW  

## 2020-03-15 ENCOUNTER — Telehealth: Payer: Self-pay

## 2020-03-15 NOTE — Telephone Encounter (Signed)
LVM

## 2020-03-25 DIAGNOSIS — Z0000 Encounter for general adult medical examination without abnormal findings: Secondary | ICD-10-CM | POA: Diagnosis not present

## 2020-03-25 DIAGNOSIS — Z125 Encounter for screening for malignant neoplasm of prostate: Secondary | ICD-10-CM | POA: Diagnosis not present

## 2020-03-25 DIAGNOSIS — E7849 Other hyperlipidemia: Secondary | ICD-10-CM | POA: Diagnosis not present

## 2020-04-01 DIAGNOSIS — R3121 Asymptomatic microscopic hematuria: Secondary | ICD-10-CM | POA: Diagnosis not present

## 2020-04-01 DIAGNOSIS — R82998 Other abnormal findings in urine: Secondary | ICD-10-CM | POA: Diagnosis not present

## 2020-04-01 DIAGNOSIS — Z1212 Encounter for screening for malignant neoplasm of rectum: Secondary | ICD-10-CM | POA: Diagnosis not present

## 2020-04-01 DIAGNOSIS — Z0000 Encounter for general adult medical examination without abnormal findings: Secondary | ICD-10-CM | POA: Diagnosis not present

## 2020-04-01 DIAGNOSIS — Z1331 Encounter for screening for depression: Secondary | ICD-10-CM | POA: Diagnosis not present

## 2020-04-02 DIAGNOSIS — H43811 Vitreous degeneration, right eye: Secondary | ICD-10-CM | POA: Diagnosis not present

## 2020-04-26 DIAGNOSIS — D3131 Benign neoplasm of right choroid: Secondary | ICD-10-CM | POA: Diagnosis not present

## 2020-04-26 DIAGNOSIS — H43811 Vitreous degeneration, right eye: Secondary | ICD-10-CM | POA: Diagnosis not present

## 2020-08-16 ENCOUNTER — Other Ambulatory Visit: Payer: Self-pay

## 2020-08-16 ENCOUNTER — Ambulatory Visit (INDEPENDENT_AMBULATORY_CARE_PROVIDER_SITE_OTHER): Payer: BLUE CROSS/BLUE SHIELD | Admitting: Podiatry

## 2020-08-16 ENCOUNTER — Other Ambulatory Visit: Payer: Self-pay | Admitting: Podiatry

## 2020-08-16 ENCOUNTER — Ambulatory Visit (INDEPENDENT_AMBULATORY_CARE_PROVIDER_SITE_OTHER): Payer: BLUE CROSS/BLUE SHIELD

## 2020-08-16 DIAGNOSIS — M7732 Calcaneal spur, left foot: Secondary | ICD-10-CM | POA: Diagnosis not present

## 2020-08-16 DIAGNOSIS — M722 Plantar fascial fibromatosis: Secondary | ICD-10-CM

## 2020-08-16 DIAGNOSIS — M7752 Other enthesopathy of left foot: Secondary | ICD-10-CM

## 2020-08-16 DIAGNOSIS — M779 Enthesopathy, unspecified: Secondary | ICD-10-CM | POA: Diagnosis not present

## 2020-08-16 DIAGNOSIS — M7750 Other enthesopathy of unspecified foot: Secondary | ICD-10-CM

## 2020-08-16 MED ORDER — MELOXICAM 15 MG PO TABS
15.0000 mg | ORAL_TABLET | Freq: Every day | ORAL | 0 refills | Status: DC | PRN
Start: 2020-08-16 — End: 2020-09-06

## 2020-08-16 NOTE — Patient Instructions (Signed)

## 2020-08-17 NOTE — Progress Notes (Signed)
Subjective:   Patient ID: Jimmy Edwards, male   DOB: 59 y.o.   MRN: [id number]   HPI 59 year old male presents the office today for concerns of pain to the back of his left heel which is been ongoing now for the last couple of weeks.  He denies any specific injury.  He has tried ice, heat, aspirin etc without any relief. He states the worst the pain is in the morning is better with activity or after being on his feet all day.  No swelling. No radiating pain. No other concerns.    Review of Systems  All other systems reviewed and are negative.  Past Medical History:  Diagnosis Date  . Anxiety   . Depression    takes lexapro  . GERD (gastroesophageal reflux disease)   . Heart murmur    Slight-has been evaluated w/out tx  . Hemorrhoids   . History of hiatal hernia   . Hyperlipidemia   . Left ureteral calculus   . Thrombophlebitis arm    from IV following colon surgery  . Tubular adenoma of colon     Past Surgical History:  Procedure Laterality Date  . APPENDECTOMY    . COLON RESECTION N/A 02/01/2015   Procedure: LAPAROSCOPIC partial cecectomy with appendectomy;  Surgeon: Jackolyn Confer, MD;  Location: [facility];  Service: General;  Laterality: N/A;  . COLONOSCOPY      3 yrs ago  . colonscopy     . EXTRACORPOREAL SHOCK WAVE LITHOTRIPSY Left 02-09-2013  . ROOT CANAL    . UPPER GI ENDOSCOPY       Current Outpatient Medications:  .  escitalopram (LEXAPRO) 20 MG tablet, Take 20 mg by mouth daily. , Disp: , Rfl:  .  ezetimibe (ZETIA) 10 MG tablet, Take 10 mg by mouth daily., Disp: , Rfl:  .  losartan (COZAAR) 50 MG tablet, Take 1 tablet (50 mg total) by mouth daily after supper. (Patient taking differently: Take 25 mg by mouth daily after supper. ), Disp: 90 tablet, Rfl: 3 .  meloxicam (MOBIC) 15 MG tablet, Take 1 tablet (15 mg total) by mouth daily as needed for pain., Disp: 30 tablet, Rfl: 0 .  Multiple Vitamin (MULTIVITAMIN WITH MINERALS) TABS, Take 1 tablet by mouth daily., Disp:  , Rfl:  .  omeprazole (PRILOSEC) 20 MG capsule, Take 20 mg by mouth daily., Disp: , Rfl:  .  rosuvastatin (CRESTOR) 20 MG tablet, Take 1 tablet (20 mg total) by mouth daily., Disp: 90 tablet, Rfl: 3  Current Facility-Administered Medications:  .  0.9 %  sodium chloride infusion, 500 mL, Intravenous, Once, Pyrtle, Lajuan Lines, MD  Allergies  Allergen Reactions  . Clindamycin Hcl Other (See Comments)    Chest pressure and tightness Other reaction(s): Other (See Comments) Burning in throat/difficulty swallowing  . Tetracyclines & Related Other (See Comments)    Chest pressure and tightness  . Tetracycline     Other reaction(s): Other (See Comments) Burning in throat, difficulty swallowing          Objective:  Physical Exam  General: AAO x3, NAD  Dermatological: Skin is warm, dry and supple bilateral.There are no open sores, no preulcerative lesions, no rash or signs of infection present.  Vascular: Dorsalis Pedis artery and Posterior Tibial artery pedal pulses are 2/4 bilateral with immedate capillary fill time. There is no pain with calf compression, swelling, warmth, erythema.   Neruologic: Grossly intact via light touch bilateral. Negative tinel sign.   Musculoskeletal: There is tenderness palpation  on the posterior aspect calcaneus and insertion of plantar fascia.  Achilles tendon appears to be intact.  Equinus is noted.  There is no pain with lateral compression of calcaneus.  No other areas of discomfort.  Muscular strength 5/5 in all groups tested bilateral.  Gait: Unassisted, Nonantalgic.       Assessment:   59 year old male with posterior heel spurs, insertional Achilles tendinitis     Plan:  -Treatment options discussed including all alternatives, risks, and complications -Etiology of symptoms were discussed -X-rays were obtained and reviewed with the patient.  Calcaneal spurring is evident.  There is no evidence of acute fracture. -Night splint  dispensed -Stretching/icing daily -Shoegear modifications, orthotics -Prescribed mobic. Discussed side effects of the medication and directed to stop if any are to occur and call the office.   Trula Slade DPM

## 2020-09-06 ENCOUNTER — Telehealth: Payer: Self-pay | Admitting: *Deleted

## 2020-09-06 ENCOUNTER — Other Ambulatory Visit: Payer: Self-pay | Admitting: Podiatry

## 2020-09-06 MED ORDER — MELOXICAM 15 MG PO TABS: 15.0000 mg | ORAL_TABLET | Freq: Every day | ORAL | 0 refills | Status: AC | PRN

## 2020-09-06 NOTE — Telephone Encounter (Signed)
sent 

## 2020-09-06 NOTE — Telephone Encounter (Signed)
Patient is wanting to have prescription for Meloxicam-15mg  (never picked up in [facility] expensive). Please resend to another pharmacy ([facility],[place]),much cheaper.

## 2020-09-07 NOTE — Telephone Encounter (Signed)
Called and left VM message that the new prescription (Meloxicam) has been sent to pharmacy of patient's choice.

## 2020-09-20 ENCOUNTER — Ambulatory Visit: Payer: BLUE CROSS/BLUE SHIELD | Admitting: Podiatry

## 2020-09-27 ENCOUNTER — Ambulatory Visit: Payer: BLUE CROSS/BLUE SHIELD | Admitting: Podiatry

## 2020-12-03 ENCOUNTER — Other Ambulatory Visit: Payer: Self-pay | Admitting: Cardiology

## 2020-12-03 DIAGNOSIS — I7121 Aneurysm of the ascending aorta, without rupture: Secondary | ICD-10-CM

## 2020-12-03 DIAGNOSIS — I10 Essential (primary) hypertension: Secondary | ICD-10-CM

## 2020-12-03 DIAGNOSIS — I712 Thoracic aortic aneurysm, without rupture: Secondary | ICD-10-CM

## 2021-01-14 ENCOUNTER — Other Ambulatory Visit: Payer: Self-pay | Admitting: Cardiology
